# Patient Record
Sex: Female | Born: 1935 | Race: White | Hispanic: No | Smoking: Former smoker
Health system: Southern US, Community
[De-identification: ages and names within clinical notes are randomized; demographics above are authoritative.]

## PROBLEM LIST (undated history)

## (undated) DIAGNOSIS — I471 Supraventricular tachycardia, unspecified: Secondary | ICD-10-CM

## (undated) DIAGNOSIS — I739 Peripheral vascular disease, unspecified: Secondary | ICD-10-CM

## (undated) DIAGNOSIS — T82198A Other mechanical complication of other cardiac electronic device, initial encounter: Secondary | ICD-10-CM

## (undated) DIAGNOSIS — E785 Hyperlipidemia, unspecified: Secondary | ICD-10-CM

## (undated) DIAGNOSIS — I2510 Atherosclerotic heart disease of native coronary artery without angina pectoris: Secondary | ICD-10-CM

## (undated) DIAGNOSIS — Z720 Tobacco use: Secondary | ICD-10-CM

## (undated) DIAGNOSIS — T148XXA Other injury of unspecified body region, initial encounter: Secondary | ICD-10-CM

## (undated) DIAGNOSIS — I5022 Chronic systolic (congestive) heart failure: Secondary | ICD-10-CM

## (undated) DIAGNOSIS — I4891 Unspecified atrial fibrillation: Secondary | ICD-10-CM

## (undated) DIAGNOSIS — I219 Acute myocardial infarction, unspecified: Secondary | ICD-10-CM

## (undated) DIAGNOSIS — I509 Heart failure, unspecified: Secondary | ICD-10-CM

## (undated) DIAGNOSIS — I10 Essential (primary) hypertension: Secondary | ICD-10-CM

## (undated) DIAGNOSIS — I442 Atrioventricular block, complete: Secondary | ICD-10-CM

## (undated) DIAGNOSIS — I2589 Other forms of chronic ischemic heart disease: Secondary | ICD-10-CM

## (undated) HISTORY — DX: Essential (primary) hypertension: I10

## (undated) HISTORY — DX: Unspecified atrial fibrillation: I48.91

## (undated) HISTORY — DX: Other injury of unspecified body region, initial encounter: T14.8XXA

## (undated) HISTORY — DX: Atrioventricular block, complete: I44.2

## (undated) HISTORY — DX: Other mechanical complication of other cardiac electronic device, initial encounter: T82.198A

## (undated) HISTORY — DX: Supraventricular tachycardia, unspecified: I47.10

## (undated) HISTORY — DX: Tobacco use: Z72.0

## (undated) HISTORY — DX: Hyperlipidemia, unspecified: E78.5

## (undated) HISTORY — DX: Acute myocardial infarction, unspecified: I21.9

## (undated) HISTORY — DX: Chronic systolic (congestive) heart failure: I50.22

## (undated) HISTORY — DX: Heart failure, unspecified: I50.9

## (undated) HISTORY — DX: Other forms of chronic ischemic heart disease: I25.89

## (undated) HISTORY — DX: Atherosclerotic heart disease of native coronary artery without angina pectoris: I25.10

## (undated) HISTORY — PX: CORONARY ARTERY BYPASS GRAFT: SHX141

## (undated) HISTORY — DX: Peripheral vascular disease, unspecified: I73.9

## (undated) HISTORY — DX: Supraventricular tachycardia: I47.1

---

## 1996-06-30 HISTORY — PX: INSERT / REPLACE / REMOVE PACEMAKER: SUR710

## 2003-03-17 ENCOUNTER — Ambulatory Visit (HOSPITAL_COMMUNITY): Admission: RE | Admit: 2003-03-17 | Discharge: 2003-03-17 | Payer: Self-pay | Admitting: *Deleted

## 2003-03-22 ENCOUNTER — Inpatient Hospital Stay (HOSPITAL_COMMUNITY): Admission: RE | Admit: 2003-03-22 | Discharge: 2003-03-24 | Payer: Self-pay | Admitting: *Deleted

## 2004-07-11 ENCOUNTER — Ambulatory Visit: Payer: Self-pay | Admitting: Internal Medicine

## 2004-09-09 ENCOUNTER — Ambulatory Visit: Payer: Self-pay | Admitting: Internal Medicine

## 2004-09-10 ENCOUNTER — Ambulatory Visit: Payer: Self-pay | Admitting: Internal Medicine

## 2004-10-31 ENCOUNTER — Ambulatory Visit: Payer: Self-pay | Admitting: Internal Medicine

## 2004-11-27 ENCOUNTER — Ambulatory Visit: Payer: Self-pay | Admitting: Cardiology

## 2004-12-02 ENCOUNTER — Inpatient Hospital Stay (HOSPITAL_COMMUNITY): Admission: AD | Admit: 2004-12-02 | Discharge: 2004-12-02 | Payer: Self-pay | Admitting: Internal Medicine

## 2004-12-06 ENCOUNTER — Ambulatory Visit: Payer: Self-pay | Admitting: Cardiology

## 2004-12-06 ENCOUNTER — Observation Stay (HOSPITAL_COMMUNITY): Admission: AD | Admit: 2004-12-06 | Discharge: 2004-12-07 | Payer: Self-pay | Admitting: Internal Medicine

## 2004-12-18 ENCOUNTER — Ambulatory Visit: Payer: Self-pay

## 2004-12-18 ENCOUNTER — Ambulatory Visit: Payer: Self-pay | Admitting: Internal Medicine

## 2005-03-10 ENCOUNTER — Ambulatory Visit: Payer: Self-pay | Admitting: Internal Medicine

## 2005-06-10 ENCOUNTER — Ambulatory Visit: Payer: Self-pay | Admitting: Internal Medicine

## 2005-10-17 ENCOUNTER — Ambulatory Visit: Payer: Self-pay | Admitting: Internal Medicine

## 2006-03-10 ENCOUNTER — Ambulatory Visit: Payer: Self-pay | Admitting: Internal Medicine

## 2006-05-15 ENCOUNTER — Ambulatory Visit: Payer: Self-pay

## 2006-06-09 ENCOUNTER — Ambulatory Visit: Payer: Self-pay | Admitting: Internal Medicine

## 2006-09-10 ENCOUNTER — Ambulatory Visit: Payer: Self-pay | Admitting: Internal Medicine

## 2006-11-19 ENCOUNTER — Ambulatory Visit: Payer: Self-pay | Admitting: Internal Medicine

## 2006-12-08 ENCOUNTER — Ambulatory Visit: Payer: Self-pay | Admitting: Internal Medicine

## 2007-03-23 ENCOUNTER — Ambulatory Visit: Payer: Self-pay | Admitting: Internal Medicine

## 2007-07-05 ENCOUNTER — Ambulatory Visit: Payer: Self-pay | Admitting: Internal Medicine

## 2007-11-11 ENCOUNTER — Ambulatory Visit: Payer: Self-pay | Admitting: Internal Medicine

## 2008-06-16 ENCOUNTER — Ambulatory Visit: Payer: Self-pay | Admitting: Internal Medicine

## 2008-07-20 ENCOUNTER — Ambulatory Visit: Payer: Self-pay | Admitting: Vascular Surgery

## 2008-09-05 ENCOUNTER — Encounter: Payer: Self-pay | Admitting: Internal Medicine

## 2008-11-02 ENCOUNTER — Encounter: Payer: Self-pay | Admitting: Internal Medicine

## 2009-06-11 ENCOUNTER — Ambulatory Visit: Payer: Self-pay | Admitting: Internal Medicine

## 2009-06-11 DIAGNOSIS — I4891 Unspecified atrial fibrillation: Secondary | ICD-10-CM

## 2009-06-11 DIAGNOSIS — I442 Atrioventricular block, complete: Secondary | ICD-10-CM

## 2009-06-11 DIAGNOSIS — I5022 Chronic systolic (congestive) heart failure: Secondary | ICD-10-CM

## 2009-06-11 DIAGNOSIS — I2589 Other forms of chronic ischemic heart disease: Secondary | ICD-10-CM | POA: Insufficient documentation

## 2009-09-10 ENCOUNTER — Telehealth: Payer: Self-pay | Admitting: Internal Medicine

## 2009-09-10 ENCOUNTER — Ambulatory Visit: Payer: Self-pay | Admitting: Internal Medicine

## 2009-09-18 ENCOUNTER — Telehealth: Payer: Self-pay | Admitting: Internal Medicine

## 2009-09-21 ENCOUNTER — Telehealth: Payer: Self-pay | Admitting: Internal Medicine

## 2009-09-21 ENCOUNTER — Encounter: Payer: Self-pay | Admitting: Internal Medicine

## 2009-10-01 ENCOUNTER — Telehealth: Payer: Self-pay | Admitting: Internal Medicine

## 2009-10-04 ENCOUNTER — Ambulatory Visit: Payer: Self-pay | Admitting: Internal Medicine

## 2009-10-04 ENCOUNTER — Encounter: Payer: Self-pay | Admitting: Internal Medicine

## 2009-10-04 ENCOUNTER — Ambulatory Visit: Payer: Self-pay | Admitting: Cardiovascular Disease

## 2009-10-08 ENCOUNTER — Ambulatory Visit: Payer: Self-pay | Admitting: Internal Medicine

## 2009-10-11 ENCOUNTER — Encounter: Payer: Self-pay | Admitting: Internal Medicine

## 2009-10-15 ENCOUNTER — Encounter (INDEPENDENT_AMBULATORY_CARE_PROVIDER_SITE_OTHER): Payer: Self-pay | Admitting: Family Medicine

## 2009-10-15 ENCOUNTER — Telehealth: Payer: Self-pay | Admitting: Internal Medicine

## 2009-10-15 ENCOUNTER — Ambulatory Visit: Payer: Self-pay | Admitting: Internal Medicine

## 2009-10-16 ENCOUNTER — Telehealth: Payer: Self-pay | Admitting: Internal Medicine

## 2009-10-16 LAB — CONVERTED CEMR LAB
BUN: 24 mg/dL — ABNORMAL HIGH (ref 6–23)
CO2: 24 meq/L (ref 19–32)
Calcium: 8.7 mg/dL (ref 8.4–10.5)
Chloride: 103 meq/L (ref 96–112)
Creatinine, Ser: 0.99 mg/dL (ref 0.40–1.20)
Glucose, Bld: 84 mg/dL (ref 70–99)
INR: 2.57 — ABNORMAL HIGH (ref <1.50)
MCHC: 33.1 g/dL (ref 30.0–36.0)
MCV: 95.8 fL (ref 78.0–100.0)
Platelets: 170 10*3/uL (ref 150–400)
Potassium: 3.6 meq/L (ref 3.5–5.3)
Prothrombin Time: 27.4 s — ABNORMAL HIGH (ref 11.6–15.2)
Sodium: 140 meq/L (ref 135–145)
WBC: 7.5 10*3/uL (ref 4.0–10.5)

## 2009-10-17 ENCOUNTER — Ambulatory Visit: Payer: Self-pay | Admitting: Internal Medicine

## 2009-10-17 ENCOUNTER — Ambulatory Visit (HOSPITAL_COMMUNITY): Admission: RE | Admit: 2009-10-17 | Discharge: 2009-10-17 | Payer: Self-pay | Admitting: Internal Medicine

## 2009-10-18 ENCOUNTER — Encounter: Payer: Self-pay | Admitting: Internal Medicine

## 2009-10-31 ENCOUNTER — Ambulatory Visit: Payer: Self-pay

## 2009-10-31 ENCOUNTER — Encounter: Payer: Self-pay | Admitting: Internal Medicine

## 2009-11-12 ENCOUNTER — Ambulatory Visit: Payer: Self-pay | Admitting: Internal Medicine

## 2009-11-15 ENCOUNTER — Ambulatory Visit (HOSPITAL_COMMUNITY): Admission: RE | Admit: 2009-11-15 | Discharge: 2009-11-15 | Payer: Self-pay | Admitting: Internal Medicine

## 2009-11-15 ENCOUNTER — Ambulatory Visit: Payer: Self-pay | Admitting: Internal Medicine

## 2009-11-28 ENCOUNTER — Ambulatory Visit: Payer: Self-pay

## 2010-02-25 ENCOUNTER — Ambulatory Visit: Payer: Self-pay | Admitting: Internal Medicine

## 2010-02-25 DIAGNOSIS — I471 Supraventricular tachycardia: Secondary | ICD-10-CM

## 2010-04-22 ENCOUNTER — Ambulatory Visit: Payer: Self-pay | Admitting: Internal Medicine

## 2010-07-25 ENCOUNTER — Ambulatory Visit: Admit: 2010-07-25 | Payer: Self-pay | Admitting: Internal Medicine

## 2010-07-28 LAB — CONVERTED CEMR LAB
Basophils Absolute: 0.1 10*3/uL (ref 0.0–0.1)
Basophils Relative: 0.9 % (ref 0.0–3.0)
Chloride: 103 meq/L (ref 96–112)
HCT: 44.3 % (ref 36.0–46.0)
Lymphocytes Relative: 17.5 % (ref 12.0–46.0)
Lymphs Abs: 1.2 10*3/uL (ref 0.7–4.0)
MCV: 95.4 fL (ref 78.0–100.0)
Neutro Abs: 4.4 10*3/uL (ref 1.4–7.7)
Neutrophils Relative %: 65.1 % (ref 43.0–77.0)
Potassium: 4.1 meq/L (ref 3.5–5.1)
Sodium: 140 meq/L (ref 135–145)
WBC: 6.7 10*3/uL (ref 4.5–10.5)
aPTT: 42.4 s — ABNORMAL HIGH (ref 21.7–28.8)

## 2010-07-30 NOTE — Letter (Signed)
Summary: Implant Record  Implant Record   Imported By: Marylou Mccoy 12/13/2009 16:53:54  _____________________________________________________________________  External Attachment:    Type:   Image     Comment:   External Document

## 2010-07-30 NOTE — Cardiovascular Report (Signed)
Summary: Office Visit   Office Visit   Imported By: Roderic Ovens 11/16/2009 16:20:07  _____________________________________________________________________  External Attachment:    Type:   Image     Comment:   External Document

## 2010-07-30 NOTE — Progress Notes (Signed)
Summary: device beeping  Phone Note Call from Patient Call back at Home Phone [phone]   Caller: Patient Summary of Call: Patient called and stated her device was beeping last night.  She only heard it the one time, not every 4 hours like we would expect with a 6949 lead fracture.  Pt was nearing ERI at last office visit.  Pt is at work, does not have [employer] box, does not have magnet with her.  She will get ride to [place] office for interrogation.  If [phone] fracture, will admit, if ERI, will schedule appt to discuss gen change with Dr Graciela Husbands.  Pt aware and agrees with plan. Gypsy Balsam RN BSN  September 10, 2009 10:39 AM

## 2010-07-30 NOTE — Progress Notes (Signed)
Summary: pre auth for MV  Phone Note From Other Clinic   Summary of Call: needs prior auth for MV- call to [facility] @ [phone]. Initial call taken by: Charlena Cross, RN, BSN,  October 01, 2009 1:30 PM  Follow-up for Phone Call        not needed per charmaine  Follow-up by: Charlena Cross, RN, BSN,  October 03, 2009 2:31 PM

## 2010-07-30 NOTE — Letter (Signed)
Summary: Implantable Device Instructions  Architectural technologist, Main Office  [address]   [place], [place] [address]   Phone: [phone]  Fax: [phone]      Implantable Device Instructions  You are scheduled for:  _____ Permanent Transvenous Pacemaker _____ Implantable Cardioverter Defibrillator _____ Implantable Loop Recorder _____ Generator Change __X___ Pocket Revision  on Thursday, Nov 15, 2009 with Dr. Graciela Husbands.  1.  Please arrive at the Short Stay Center at [facility] at 6 AM on the day of your procedure.  2.  Do not eat or drink the night before your procedure.  3.  Complete lab work today.  The lab at [facility] is open from 8:30 AM to 1:30 PM and from 2:30 PM to 5:00 PM.  The lab at [facility] is open from 7:30 AM to 5:30 PM.  You do not have to be fasting.  4.  Ok to take medications with water morning of procedure.  5.  Bring your insurance cards and a list of your medications.  6.  Wash your chest and neck with antibacterial soap (any brand) the evening before and the morning of your procedure.  Rinse well.    *If you have ANY questions after you get home, please call the office [phone].  *Every attempt is made to prevent procedures from being rescheduled.  Due to the nauture of Electrophysiology, rescheduling can happen.  The physician is always aware and directs the staff when this occurs.

## 2010-07-30 NOTE — Progress Notes (Signed)
Summary: gen change  Phone Note Outgoing Call Call back at [facility] Phone [phone]   Call placed by: Gypsy Balsam RN BSN,  September 21, 2009 11:42 AM Summary of Call: Per [employer], gold plated device for patient will be shipped on 4-15.  Pt has appt with Dr Graciela Husbands on 4-11 in [place] to discuss gen change. Gen change scheduled for 10-17-09 at 10AM at [facility].  Pt aware and agrees with plan.  Will do pre-procedure labs on 4-11 at office visit.  [employer] aware of plans for change out. Gypsy Balsam RN BSN  September 21, 2009 11:44 AM      Appended Document: gen change    Clinical Lists Changes  Orders: Added new Referral order of Nuclear Stress Test (Nuc Stress Test) - Signed

## 2010-07-30 NOTE — Progress Notes (Signed)
Summary: lab work  Phone Note Call from Patient Call back at [employer] [phone]   Caller: Patient Reason for Call: Talk to Nurse Summary of Call: having a device change out Wednesday and has not heard anything about lab work  Initial call taken by: Migdalia Dk,  October 15, 2009 9:40 AM  Follow-up for Phone Call        10/15/09-PT CALLING STATING LAB WORK NOT DONE --HAVING  A DEVICE CHANGE OUT WED. 4/20--PT NOTIFIED TO COME IN FOR LABS TODAY--PT AGREES--ORDERS IN IDX--NT Follow-up by: Ledon Snare, RN,  October 15, 2009 9:57 AM     Appended Document: lab work 10/15/09--[name] goes to [employer] called and scheduled lab appoint to be done in [place] today--pt agrees--nt

## 2010-07-30 NOTE — Cardiovascular Report (Signed)
Summary: Pre Op Orders  Pre Op Orders   Imported By: Roderic Ovens 11/28/2009 14:17:56  _____________________________________________________________________  External Attachment:    Type:   Image     Comment:   External Document

## 2010-07-30 NOTE — Assessment & Plan Note (Signed)
Summary: ROV/AMD   CC:  ROV; Felt "twinges" approx 3 weeks ago.  History of Present Illness:   [name] is seen in followup for congestive heart failure in the setting of ischemic cardiomyopathy. She is status post CRT-D implantation and notably has a gold device secondary to an allergy to the metal.  The patient denies   chest pain, edema or palpitations   Device has reached ERI.  Her CRT device was upgraded procedure. It is noteworthy that she has an abandoned but capped ventricular rate sense lead; she also has a 6949-lead. Reviewing the operative note it sounds like it was quite an ordeal getting the defibrillator lead and the LV lead in place.  She has undergone  pre-generator replacement Myoview scanning demonstrating an ejection fraction of 36% scarring in the anteroapical anteroseptal region without evidence of significant ischemia. The LV is dilated.  She has done really much better. she is getting ready to retire from [employer] and she is looking forward to this. The patient denies SOB, chest pain, edema or palpitations     Problems Prior to Update: 1)  Av Block, Complete  (ICD-426.0) 2)  Atrial Fibrillation, Paroxysmal  (ICD-427.31) 3)  Implantable Defibrillator Mdt Crt  (ICD-V45.02) 4)  Systolic Heart Failure, Chronic  (ICD-428.22) 5)  Cardiomyopathy, Ischemic  (ICD-414.8) 6)  6949 Lead  (ICD-996.04)  Current Medications (verified): 1)  Warfarin Sodium 5 Mg Tabs (Warfarin Sodium) .... Use As Directed By Anticoagulation Clinic 2)  Losartan Potassium 100 Mg Tabs (Losartan Potassium) .... Take 1 By Mouth Once Daily 3)  Metoprolol Tartrate 50 Mg Tabs (Metoprolol Tartrate) .Marland Kitchen.. 1 By Mouth Daily 4)  Simvastatin 40 Mg Tabs (Simvastatin) .... Take One Tablet By Mouth Daily At Bedtime 5)  Furosemide 40 Mg Tabs (Furosemide) .Marland Kitchen.. 1 By Mouth Daily 6)  Aspirin 81 Mg Tbec (Aspirin) .... Take One Tablet By Mouth Daily 7)  Multivitamins  Tabs (Multiple Vitamin) .Marland Kitchen.. 1 By Mouth  Daily  Allergies: 1)  ! * Penicillin 2)  ! * Toradol 3)  ! * Neosporin 4)  ! * Sulfa 5)  ! * Metal 6)  ! * Tetanus Shot  Past History:  Past Medical History: Last updated: 06/11/2009 ischemic cardiomyopathy Congestive heart failure complete heart block device allergy atrial fibrillation  Vital Signs:  Patient profile:   75 year old female Height:      61 inches Weight:      100 pounds BMI:     18.96 Pulse rate:   77 / minute BP sitting:   122 / 62  (right arm)  Vitals Entered By: [name], EMT-P (October 08, 2009 10:39 AM)  Physical Exam  General:  The patient was alert and oriented in no acute distress. HEENT Normal.  Neck veins were flat, carotids were brisk.  Lungs were clear.  Heart sounds were regular without murmurs or gallops.  Abdomen was soft with active bowel sounds. There is no clubbing cyanosis or edema. Skin Warm and dry She is  alert and oriented. affect is much more engaging it has been over the years. She is almost smiling   EKG  Procedure date:  10/08/2009  Findings:      sinus rhythm with PA-C Chris pacing consistent with biventricular pacing the QRS remains rather broad but the interval 0.17   ICD Specifications Following MD:  Sherryl Manges, MD     ICD Vendor:  [employer]     ICD Model Number:  [phone]     ICD Serial  Number:  [id number] H ICD DOI:  12/06/2004     ICD Implanting MD:  Sherryl Manges, MD  Lead 1:    Location: RA     DOI: 12/06/2004     Model #: 4524     Serial #: [id number]     Status: active Lead 2:    Location: RV     DOI: 12/06/2004     Model #: 7829     Serial #: [id number]     Status: active Lead 3:    Location: LV     DOI: 12/06/2004     Model #: 7846     Serial #: [id number]     Status: active  Indications::  ICM, CHF, METAL ALLERGY   ICD Follow Up ICD Dependent:  No      Episodes Coumadin:  Yes  Brady Parameters Mode DDDR     Lower Rate Limit:  60     Upper Rate Limit 120 PAV 130     Sensed AV Delay:  100  Tachy  Zones VF:  207     VT:  250     VT1:  167     Impression & Recommendations:  Problem # 1:  IMPLANTABLE  DEFIBRILLATOR MDT CRT (ICD-V45.02) Device parameters and data were reviewed and no changes were made  her device needs to undergo generator replacement. Given the fact that she has a 6949-lead in place, 2 options to present themselves apart from trying to place a new lead which based on the operative report review but likely be extremely difficult. The first is 2 pirate  the previously implanted RV rate sense lead; the alternative would be to take the 4 194 and plac it  in the RV rate sense port and move the defibrillator rate sense portion to the LV lead slot. I would prefer probably to do the former in the event that the lead is available.  We also reviewed the potential risks including infection she understands these risks and is willing to proceed.  Will plan to hold her Coumadin for a couple of days in anticipation of the procedure  Problem # 2:  CARDIOMYOPATHY, ISCHEMIC (ICD-414.8) this is stable based on her Myoview.  I take it is also reasonable to plan to discontinue her aspirin in the setting of her being on Coumadin Her updated medication list for this problem includes:    Warfarin Sodium 5 Mg Tabs (Warfarin sodium) ..... Use as directed by anticoagulation clinic    Losartan Potassium 100 Mg Tabs (Losartan potassium) .Marland Kitchen... Take 1 by mouth once daily    Metoprolol Tartrate 50 Mg Tabs (Metoprolol tartrate) .Marland Kitchen... 1 by mouth daily    Furosemide 40 Mg Tabs (Furosemide) .Marland Kitchen... 1 by mouth daily    Aspirin 81 Mg Tbec (Aspirin) .Marland Kitchen... Take one tablet by mouth daily  Problem # 3:  SYSTOLIC HEART FAILURE, CHRONIC (ICD-428.22) stable on her current medications; consideration can be given to the use of Aldactone based on the emphasis trial Her updated medication list for this problem includes:    Warfarin Sodium 5 Mg Tabs (Warfarin sodium) ..... Use as directed by anticoagulation clinic     Losartan Potassium 100 Mg Tabs (Losartan potassium) .Marland Kitchen... Take 1 by mouth once daily    Metoprolol Tartrate 50 Mg Tabs (Metoprolol tartrate) .Marland Kitchen... 1 by mouth daily    Furosemide 40 Mg Tabs (Furosemide) .Marland Kitchen... 1 by mouth daily    Aspirin 81 Mg Tbec (Aspirin) .Marland Kitchen... Take one tablet by mouth daily  Problem # 4:  AV BLOCK, COMPLETE (ICD-426.0) stable post device implant Her updated medication list for this problem includes:    Warfarin Sodium 5 Mg Tabs (Warfarin sodium) ..... Use as directed by anticoagulation clinic    Metoprolol Tartrate 50 Mg Tabs (Metoprolol tartrate) .Marland Kitchen... 1 by mouth daily    Aspirin 81 Mg Tbec (Aspirin) .Marland Kitchen... Take one tablet by mouth daily  Patient Instructions: 1)  Copy sent to: 2)  Your physician recommends that you schedule a follow-up appointment in: following device generator replacement 3)  Your physician has recommended that you have a defibrillator generator replacement.  . This is done in the hospital and usually will not require an overnight stay. Please see the instruction sheet given to you today for more information.

## 2010-07-30 NOTE — Procedures (Signed)
Summary: wound check   Allergies: 1)  ! * Penicillin 2)  ! * Toradol 3)  ! * Neosporin 4)  ! * Sulfa 5)  ! * Metal 6)  ! * Tetanus Shot   ICD Specifications Following MD:  Sherryl Manges, MD     ICD Vendor:  [employer]     ICD Model Number:  [phone]     ICD Serial Number:  [id number] ICD DOI:  10/17/2009     ICD Implanting MD:  Sherryl Manges, MD  Lead 1:    Location: RA     DOI: 12/06/2004     Model #: 4524     Serial #: [id number]     Status: active Lead 2:    Location: RV     DOI: 12/06/2004     Model #: 5784     Serial #: [id number]     Status: active Lead 3:    Location: LV     DOI: 12/06/2004     Model #: 4194     Serial #: [id number]     Status: active Lead 4:    Location: RV     DOI: 05/30/1997     Model #: 7253     Serial #: [id number]     Status: active  Indications::  ICM, CHF, METAL ALLERGY  Explantation Comments: 10/17/09 [employer] Sentry 2595/[id number] H explanted.  ICD Follow Up ICD Dependent:  No      Episodes Coumadin:  Yes  Brady Parameters Mode DDDR     Lower Rate Limit:  60     Upper Rate Limit 120 PAV 130     Sensed AV Delay:  100  Tachy Zones VF:  207     VT:  250     VT1:  167     Tech Comments:  Steri-strips removed by patient.  Wound well healed.  ROV 8-11 SK as scheduled. Gypsy Balsam RN BSN  November 28, 2009 3:44 PM

## 2010-07-30 NOTE — Assessment & Plan Note (Signed)
Summary: rov/pt has new defib/wire sticking out   CC:  wound check/ pt feels she has a defib wire sticking out.  It is not through the skin .  History of Present Illness:   [name] is seen in followup for congestive heart failure in the setting of ischemic cardiomyopathy. She is status post CRT-D implantation and notably has a gold device secondary to an allergy to the metal.  The patient denies   chest pain, edema or palpitations   Device reached ERI.  Her CRT device was upgraded procedure. It is noteworthy that she had had an abandoned but capped ventricular rate sense lead; she also had a 6949-lead. the time of her procedure we ended up pirating the previously implanted rate sense lead and capping the rate sense portion of the 6949 lead .  She has undergone  pre-generator replacement Myoview scanning demonstrating an ejection fraction of 36% scarring in the anteroapical anteroseptal region without evidence of significant ischemia. The LV is dilated.  She comes in today with complaints that the lead is protruding and indeed is.  She denies symptoms of chest pain or shortness of breath. The device pocket is otherwise healed well     Current Medications (verified): 1)  Warfarin Sodium 5 Mg Tabs (Warfarin Sodium) .... Use As Directed By Anticoagulation Clinic 2)  Losartan Potassium 100 Mg Tabs (Losartan Potassium) .... Take 1 By Mouth Once Daily 3)  Metoprolol Tartrate 50 Mg Tabs (Metoprolol Tartrate) .Marland Kitchen.. 1 By Mouth Daily 4)  Simvastatin 40 Mg Tabs (Simvastatin) .... Take One Tablet By Mouth Daily At Bedtime 5)  Furosemide 40 Mg Tabs (Furosemide) .Marland Kitchen.. 1 By Mouth Daily 6)  Aspirin 81 Mg Tbec (Aspirin) .... Take One Tablet By Mouth Daily 7)  Multivitamins  Tabs (Multiple Vitamin) .Marland Kitchen.. 1 By Mouth Daily  Allergies (verified): 1)  ! * Penicillin 2)  ! * Toradol 3)  ! * Neosporin 4)  ! * Sulfa 5)  ! * Metal 6)  ! * Tetanus Shot  Past History:  Past Medical History: Last updated:  2009-07-06 ischemic cardiomyopathy Congestive heart failure complete heart block device allergy atrial fibrillation  Family History: Last updated: 07/06/2009 Mother-deceased at age 51. heart failure Father-deceased at age 10. heart failure Sisters-2 both deceased. one at age 10 from heart failure. one at age 5 from heart failure Brothers-6 total. one at age 17 from acute nephritis. one at age 50 from drowning. one at age 27 from heart failure. one at age 79 from heart failure and stroke. 2 brothers still living (one age 49,has had 2 bypasses and has arrythmia) (one age 1,has had heart attacks and strokes). Children-2 deceased. one at age 3months from SIDS. one at age 62 from meningitis and staff infection. 2 still living. 7 grandchildren and 3 great grandchildren  Social History: Last updated: 07/06/09 smoke-1/2 pack daily alcohol-no widowed lives in [place] 2 children (had 4)  Vital Signs:  Patient profile:   75 year old female Height:      61 inches Weight:      99 pounds BMI:     18.77 Pulse rate:   120 / minute Pulse rhythm:   regular BP sitting:   114 / 74  (left arm) Cuff size:   regular  Vitals Entered By: Judithe Modest CMA (Nov 12, 2009 2:55 PM)  Physical Exam  General:  The patient was alert and oriented in no acute distress. HEENT Normal.  Neck veins were flat, carotids were brisk.  Lungs  were clear.  Heart sounds were regular without murmurs or gallops.  Abdomen was soft with active bowel sounds. There is no clubbing cyanosis or edema. Skin Warm and dry there is mild ecchymosis over the pocket. There is protrusion of what is presumably a 6949 rate sense lead on the lateral aspect of the pocket    ICD Specifications Following MD:  Sherryl Manges, MD     ICD Vendor:  [employer]     ICD Model Number:  [id number]     ICD Serial Number:  [id number] H ICD DOI:  10/17/2009     ICD Implanting MD:  Sherryl Manges, MD  Lead 1:    Location: RA     DOI: 12/06/2004      Model #: 4524     Serial #: [id number]     Status: active Lead 2:    Location: RV     DOI: 12/06/2004     Model #: 5784     Serial #: [id number]     Status: active Lead 3:    Location: LV     DOI: 12/06/2004     Model #: 4194     Serial #: [id number]     Status: active Lead 4:    Location: RV     DOI: 05/30/1997     Model #: 7253     Serial #: [id number]     Status: active  Indications::  ICM, CHF, METAL ALLERGY  Explantation Comments: 10/17/09 [employer] Sentry 2595/[id number] H explanted.  ICD Follow Up ICD Dependent:  No      Episodes Coumadin:  Yes  Brady Parameters Mode DDDR     Lower Rate Limit:  60     Upper Rate Limit 120 PAV 130     Sensed AV Delay:  100  Tachy Zones VF:  207     VT:  250     VT1:  167     Impression & Recommendations:  Problem # 1:  6949 LEAD (ICD-996.04) 6949-lead is protruding. He will need to be revised or so as to avoid penetration through the skin. She is advised of the potential risk of infection related to this occurrence or the revision procedure itself  Problem # 2:  ATRIAL FIBRILLATION, PAROXYSMAL (ICD-427.31) she is currently in atrial fib lesion. She's had 3 episodes since device generator replacement. In the event that she is still in on Thursday we will cardiovert her. She is on Coumadin we'll check her INR  her atrial fibrillation is rapid. I've asked her to double her metoprolol. We'll also clarify whether she is taking tartrate or succinate Her updated medication list for this problem includes:    Warfarin Sodium 5 Mg Tabs (Warfarin sodium) ..... Use as directed by anticoagulation clinic    Metoprolol Tartrate 50 Mg Tabs (Metoprolol tartrate) .Marland Kitchen... 1 by mouth daily    Aspirin 81 Mg Tbec (Aspirin) .Marland Kitchen... Take one tablet by mouth daily  Orders: EKG w/ Interpretation (93000) TLB-CBC Platelet - w/Differential (85025-CBCD) TLB-BMP (Basic Metabolic Panel-BMET) (80048-METABOL) TLB-PT (Protime) (85610-PTP) TLB-PTT (85730-PTTL)  Problem # 3:   CARDIOMYOPATHY, ISCHEMIC (ICD-414.8) stable on her current medications. Her updated medication list for this problem includes:    Warfarin Sodium 5 Mg Tabs (Warfarin sodium) ..... Use as directed by anticoagulation clinic    Losartan Potassium 100 Mg Tabs (Losartan potassium) .Marland Kitchen... Take 1 by mouth once daily    Metoprolol Tartrate 50 Mg Tabs (Metoprolol tartrate) .Marland Kitchen... 1 by mouth daily    Furosemide 40 Mg  Tabs (Furosemide) .Marland Kitchen... 1 by mouth daily    Aspirin 81 Mg Tbec (Aspirin) .Marland Kitchen... Take one tablet by mouth daily  Problem # 4:  IMPLANTABLE  DEFIBRILLATOR MDT CRT (ICD-V45.02) Device parameters and data were reviewed and no changes were made  Patient Instructions: 1)  You have been scheduled for a pocket revision.  See instruction sheet given to you today.  Appended Document: [facility]     Allergies: 1)  ! * Penicillin 2)  ! * Toradol 3)  ! * Neosporin 4)  ! * Sulfa 5)  ! * Metal 6)  ! * Tetanus Shot   EKG  Procedure date:  11/12/2009  Findings:      atrial fibrillation at 119 beats per minute with ventricular sensed response on she has a right bundle left posterior fascicular block at baseline   ICD Specifications Following MD:  Sherryl Manges, MD     ICD Vendor:  [employer]     ICD Model Number:  [id number]     ICD Serial Number:  [id number] ICD DOI:  10/17/2009     ICD Implanting MD:  Sherryl Manges, MD  Lead 1:    Location: RA     DOI: 12/06/2004     Model #: 4524     Serial #: [id number]     Status: active Lead 2:    Location: RV     DOI: 12/06/2004     Model #: 7829     Serial #: [id number]     Status: active Lead 3:    Location: LV     DOI: 12/06/2004     Model #: 4194     Serial #: [id number]     Status: active Lead 4:    Location: RV     DOI: 05/30/1997     Model #: [id number]     Serial #: [id number]     Status: active  Indications::  ICM, CHF, METAL ALLERGY  Explantation Comments: 10/17/09 [employer]onic Sentry 1027/[id number] H explanted.  ICD Follow Up ICD Dependent:   No      Episodes Coumadin:  Yes  Brady Parameters Mode DDDR     Lower Rate Limit:  60     Upper Rate Limit 120 PAV 130     Sensed AV Delay:  100  Tachy Zones VF:  207     VT:  250     VT1:  167

## 2010-07-30 NOTE — Progress Notes (Signed)
Summary: holding coumadin  Phone Note Outgoing Call   Summary of Call: called pt to have her hold coumadin today for upcoming procedure.  Initial call taken by: Charlena Cross, RN, BSN,  October 16, 2009 10:05 AM

## 2010-07-30 NOTE — Procedures (Signed)
Summary: [facility]   Current Medications (verified): 1)  Warfarin Sodium 5 Mg Tabs (Warfarin Sodium) .... Use As Directed By Anticoagulation Clinic 2)  Losartan Potassium 100 Mg Tabs (Losartan Potassium) .... Take 1 By Mouth Once Daily 3)  Metoprolol Tartrate 50 Mg Tabs (Metoprolol Tartrate) .Marland Kitchen.. 1 By Mouth Daily 4)  Simvastatin 40 Mg Tabs (Simvastatin) .... Take One Tablet By Mouth Daily At Bedtime 5)  Furosemide 40 Mg Tabs (Furosemide) .Marland Kitchen.. 1 By Mouth Daily 6)  Aspirin 81 Mg Tbec (Aspirin) .... Take One Tablet By Mouth Daily 7)  Multivitamins  Tabs (Multiple Vitamin) .Marland Kitchen.. 1 By Mouth Daily  Allergies (verified): 1)  ! * Penicillin 2)  ! * Toradol 3)  ! * Neosporin 4)  ! * Sulfa 5)  ! * Metal 6)  ! * Tetanus Shot   ICD Specifications Following MD:  Sherryl Manges, MD     ICD Vendor:  [employer]     ICD Model Number:  [id number]     ICD Serial Number:  [id number] ICD DOI:  10/17/2009     ICD Implanting MD:  Sherryl Manges, MD  Lead 1:    Location: RA     DOI: 12/06/2004     Model #: 4524     Serial #: [id number]     Status: active Lead 2:    Location: RV     DOI: 12/06/2004     Model #: 1308     Serial #: [id number]     Status: active Lead 3:    Location: LV     DOI: 12/06/2004     Model #: 4194     Serial #: [id number]     Status: active Lead 4:    Location: RV     DOI: 05/30/1997     Model #: 4010     Serial #: [id number]     Status: active  Indications::  ICM, CHF, METAL ALLERGY  Explantation Comments: 10/17/09 [employer] Sentry [id number] H explanted.  ICD Follow Up Remote Check?  No Battery Voltage:  3.21 V     Charge Time:  8.6 seconds     Underlying rhythm:  SR ICD Dependent:  No       ICD Device Measurements Atrium:  Amplitude: 2.5 mV, Impedance: 418 ohms, Threshold: 0.75 V at 0.40 msec Right Ventricle:  Amplitude: >20 mV, Impedance: 532 ohms, Threshold: 0.75 V at 0.40 msec Left Ventricle:  Impedance: 437 ohms, Threshold: 0.75 V at 0.40 msec Shock Impedance:  46/62 ohms   Episodes MS Episodes:  24     Percent Mode Switch:  1.2%     Coumadin:  Yes Shock:  0     ATP:  1     Nonsustained:  0     Atrial Pacing:  10.2%     Ventricular Pacing:  97%  Brady Parameters Mode DDDR     Lower Rate Limit:  60     Upper Rate Limit 120 PAV 130     Sensed AV Delay:  100  Tachy Zones VF:  207     VT:  250     VT1:  167     Next Cardiology Appt Due:  01/28/2010 Tech Comments:  NO REDNESS AND LITTLE SWELLING AROUND DEVICE.  DEVICE IS SUPERFICIAL AND PT CAN TELL DIFFERENCE W/NEW DEVICE. PT HAS LEFT ANTECUBITAL HEMATOMA ON LEFT ARM THAT IS RESOLVING.  NORMAL DEVICE FUNCTION.  1 TREATED VT EPISODE W/ATP THERAPY.  1 MONITORED VT EPISODE.  24  AF EPISODES + COUMADIN.  ROV IN 3 MTHS AT [place]. [name]

## 2010-07-30 NOTE — Assessment & Plan Note (Signed)
Summary: F2M/AMD   Visit Type:  Follow-up Primary Provider:  Dr.  Lawerance Bach  CC:  no complaints.  History of Present Illness:   [name] is seen in followup for congestive heart failure in the setting of ischemic cardiomyopathy. She is status post CRT-D implantation and notably has a gold device secondary to an allergy to the metal.  The patient denies   chest pain, edema or palpitations   She had had an abandoned but capped ventricular rate sense lead; she also had a 6949-lead. the time of her procedure we ended up pirating the previously implanted rate sense lead and capping the rate sense portion of the 6949 lead  but this ended up coming free from behind the device and began to burn to the skin into the pocket was revised in May  She has undergone  pre-generator replacement Myoview scanning demonstrating an ejection fraction of 36% scarring in the anteroapical anteroseptal region without evidence of significant ischemia. The LV is dilated.   She has had recurrent tachypalpitatoins;  this is not of long standing   The patient denies SOB, chest pain, edema or palpitations     Current Medications (verified): 1)  Losartan Potassium 100 Mg Tabs (Losartan Potassium) .... Take 1 By Mouth Once Daily 2)  Metoprolol Tartrate 50 Mg Tabs (Metoprolol Tartrate) .... Take 1 Tablet By Mouth Two Times A Day 3)  Simvastatin 40 Mg Tabs (Simvastatin) .... Take One Tablet By Mouth Daily At Bedtime 4)  Furosemide 40 Mg Tabs (Furosemide) .Marland Kitchen.. 1 By Mouth Daily 5)  Aspirin 81 Mg Tbec (Aspirin) .... Take One Tablet By Mouth Daily 6)  Multivitamins  Tabs (Multiple Vitamin) .Marland Kitchen.. 1 By Mouth Daily 7)  Warfarin Sodium 6 Mg Tabs (Warfarin Sodium) .... One Tablet Once Daily  Allergies (verified): 1)  ! * Penicillin 2)  ! * Toradol 3)  ! * Neosporin 4)  ! * Sulfa 5)  ! * Metal 6)  ! * Tetanus Shot  Past History:  Past Medical History: Last updated: 06/11/2009 ischemic cardiomyopathy Congestive heart  failure complete heart block device allergy atrial fibrillation  Vital Signs:  Patient profile:   75 year old female Height:      61 inches Weight:      104 pounds BMI:     19.72 Pulse rate:   75 / minute BP sitting:   132 / 74  (left arm) Cuff size:   regular  Vitals Entered By: Hardin Negus, RMA (April 22, 2010 8:53 AM)  Physical Exam  General:  The patient was alert and oriented in no acute distress. HEENT Normal.  Neck veins were flat, carotids were brisk.  Lungs were clear.  Heart sounds were regular without murmurs or gallops.  Abdomen was soft with active bowel sounds. There is no clubbing cyanosis or edema. Skin Warm and dry     ICD Specifications Following MD:  Sherryl Manges, MD     ICD Vendor:  [employer]     ICD Model Number:  [id number]     ICD Serial Number:  [id number] ICD DOI:  10/17/2009     ICD Implanting MD:  Sherryl Manges, MD  Lead 1:    Location: RA     DOI: 12/06/2004     Model #: 4524     Serial #: [id number]     Status: active Lead 2:    Location: RV     DOI: 12/06/2004     Model #: 2130     Serial #:  [id number]     Status: active Lead 3:    Location: LV     DOI: 12/06/2004     Model #: 4194     Serial #: [id number]     Status: active Lead 4:    Location: RV     DOI: 05/30/1997     Model #: 7829     Serial #: [id number]     Status: active  Indications::  ICM, CHF, METAL ALLERGY  Explantation Comments: 10/17/09 [employer] Sentry [id number]/[id number] H explanted.  ICD Follow Up Remote Check?  No Battery Voltage:  3.15 V     Charge Time:  8.4 seconds     Underlying rhythm:  SR ICD Dependent:  No       ICD Device Measurements Atrium:  Amplitude: 0.3 mV, Impedance: 418 ohms, Threshold: 0.75 V at 0.4 msec Right Ventricle:  Amplitude: 20 mV, Impedance: 532 ohms, Threshold: 0.625 V at 0.4 msec Shock Impedance: 47/65 ohms   Episodes MS Episodes:  35     Percent Mode Switch:  0.7%     Coumadin:  Yes Shock:  0     ATP:  0     Nonsustained:  6      Brady  Parameters Mode DDDR     Lower Rate Limit:  60     Upper Rate Limit 120 PAV 130     Sensed AV Delay:  100  Tachy Zones VF:  200     VT:  158     VT1:  167     Next Remote Date:  07/25/2010     Next Cardiology Appt Due:  03/31/2011 Tech Comments:  NSVT episodes all 1:1 SVT.  [facility] transmissions every 3 months.  ROV 1 year with Dr. Graciela Husbands in [place]. Altha Harm, LPN  April 22, 2010 10:02 AM   Impression & Recommendations:  Problem # 1:  SVT/ PSVT/ PAT (ICD-427.0) recruurnt below detection  will follow  Her updated medication list for this problem includes:    Metoprolol Tartrate 50 Mg Tabs (Metoprolol tartrate) .Marland Kitchen... Take 1 tablet by mouth two times a day    Aspirin 81 Mg Tbec (Aspirin) .Marland Kitchen... Take one tablet by mouth daily    Warfarin Sodium 6 Mg Tabs (Warfarin sodium) ..... One tablet once daily  Problem # 2:  AV BLOCK, COMPLETE (ICD-426.0)  Incomplete Her updated medication list for this problem includes:    Metoprolol Tartrate 50 Mg Tabs (Metoprolol tartrate) .Marland Kitchen... Take 1 tablet by mouth two times a day    Aspirin 81 Mg Tbec (Aspirin) .Marland Kitchen... Take one tablet by mouth daily    Warfarin Sodium 6 Mg Tabs (Warfarin sodium) ..... One tablet once daily  Her updated medication list for this problem includes:    Metoprolol Tartrate 50 Mg Tabs (Metoprolol tartrate) .Marland Kitchen... Take 1 tablet by mouth two times a day    Aspirin 81 Mg Tbec (Aspirin) .Marland Kitchen... Take one tablet by mouth daily    Warfarin Sodium 6 Mg Tabs (Warfarin sodium) ..... One tablet once daily  Problem # 3:  ATRIAL FIBRILLATION, PAROXYSMAL (ICD-427.31) no intercurrent af;  will stop her asa Her updated medication list for this problem includes:    Metoprolol Tartrate 50 Mg Tabs (Metoprolol tartrate) .Marland Kitchen... Take 1 tablet by mouth two times a day    Aspirin 81 Mg Tbec (Aspirin) .Marland Kitchen... Take one tablet by mouth daily    Warfarin Sodium 6 Mg Tabs (Warfarin sodium) ..... One tablet once daily  Orders: EKG w/ Interpretation  (  93000)  Problem # 4:  IMPLANTABLE  DEFIBRILLATOR MDT CRT (ICD-V45.02) Device parameters and data were reviewed and no changes were made  Problem # 5:  SYSTOLIC HEART FAILURE, CHRONIC (ICD-428.22)  stabel  Her updated medication list for this problem includes:    Losartan Potassium 100 Mg Tabs (Losartan potassium) .Marland Kitchen... Take 1 by mouth once daily    Metoprolol Tartrate 50 Mg Tabs (Metoprolol tartrate) .Marland Kitchen... Take 1 tablet by mouth two times a day    Furosemide 40 Mg Tabs (Furosemide) .Marland Kitchen... 1 by mouth daily    Aspirin 81 Mg Tbec (Aspirin) .Marland Kitchen... Take one tablet by mouth daily    Warfarin Sodium 6 Mg Tabs (Warfarin sodium) ..... One tablet once daily  Her updated medication list for this problem includes:    Losartan Potassium 100 Mg Tabs (Losartan potassium) .Marland Kitchen... Take 1 by mouth once daily    Metoprolol Tartrate 50 Mg Tabs (Metoprolol tartrate) .Marland Kitchen... Take 1 tablet by mouth two times a day    Furosemide 40 Mg Tabs (Furosemide) .Marland Kitchen... 1 by mouth daily    Aspirin 81 Mg Tbec (Aspirin) .Marland Kitchen... Take one tablet by mouth daily    Warfarin Sodium 6 Mg Tabs (Warfarin sodium) ..... One tablet once daily  Problem # 6:  CARDIOMYOPATHY, ISCHEMIC (ICD-414.8) stable withourt chest pain Her updated medication list for this problem includes:    Losartan Potassium 100 Mg Tabs (Losartan potassium) .Marland Kitchen... Take 1 by mouth once daily    Metoprolol Tartrate 50 Mg Tabs (Metoprolol tartrate) .Marland Kitchen... Take 1 tablet by mouth two times a day    Furosemide 40 Mg Tabs (Furosemide) .Marland Kitchen... 1 by mouth daily    Aspirin 81 Mg Tbec (Aspirin) .Marland Kitchen... Take one tablet by mouth daily    Warfarin Sodium 6 Mg Tabs (Warfarin sodium) ..... One tablet once daily

## 2010-07-30 NOTE — Miscellaneous (Signed)
Summary: Device change out  Clinical Lists Changes  Observations: Added new observation of ICDLEADSTAT4: active (10/18/2009 13:21) Added new observation of ICDLEADSER4: [id number] V (10/18/2009 13:21) Added new observation of ICDLEADMOD4: 5092  (10/18/2009 13:21) Added new observation of ICDLEADDOI4: 05/30/1997  (10/18/2009 13:21) Added new observation of ICDLEADLOC4: RV  (10/18/2009 13:21) Added new observation of ICD IMPL DTE: 10/17/2009  (10/18/2009 13:21) Added new observation of ICD SERL#: [id number] H  (10/18/2009 13:21) Added new observation of ICD MODL#: D224TRK  (10/18/2009 13:21) Added new observation of ICDEXPLCOMM: 10/17/09 [employer]onic Sentry 1601/[id number] H explanted.  (10/18/2009 13:21)       ICD Specifications Following MD:  Sherryl Manges, MD     ICD Vendor:  [employer]     ICD Model Number:  [phone]     ICD Serial Number:  [id number] ICD DOI:  10/17/2009     ICD Implanting MD:  Sherryl Manges, MD  Lead 1:    Location: RA     DOI: 12/06/2004     Model #: 4524     Serial #: [id number]     Status: active Lead 2:    Location: RV     DOI: 12/06/2004     Model #: 7371     Serial #: [id number]     Status: active Lead 3:    Location: LV     DOI: 12/06/2004     Model #: 4194     Serial #: [id number]     Status: active Lead 4:    Location: RV     DOI: 05/30/1997     Model #: 3818     Serial #: [id number]     Status: active  Indications::  ICM, CHF, METAL ALLERGY  Explantation Comments: 10/17/09 [employer]onic Sentry 7893/[id number] H explanted.  ICD Follow Up ICD Dependent:  No      Episodes Coumadin:  Yes  Brady Parameters Mode DDDR     Lower Rate Limit:  60     Upper Rate Limit 120 PAV 130     Sensed AV Delay:  100  Tachy Zones VF:  207     VT:  250     VT1:  167

## 2010-07-30 NOTE — Assessment & Plan Note (Signed)
Summary: PC2/AMD-11:00 APPT   Visit Type:  Follow-up Primary Provider:  Dr.  Lawerance Bach  CC:  "Doing well".  .  History of Present Illness:   [name] is seen in followup for congestive heart failure in the setting of ischemic cardiomyopathy. She is status post CRT-D implantation and notably has a gold device secondary to an allergy to the metal.  The patient denies   chest pain, edema or palpitations   She had had an abandoned but capped ventricular rate sense lead; she also had a 6949-lead. the time of her procedure we ended up pirating the previously implanted rate sense lead and capping the rate sense portion of the 6949 lead  but this ended up coming free from behind the device and began to burn to the skin into the pocket was revised in May  She has undergone  pre-generator replacement Myoview scanning demonstrating an ejection fraction of 36% scarring in the anteroapical anteroseptal region without evidence of significant ischemia. The LV is dilated.   She has had recurrent tachypalpitatoins;  this is not of long standing     Current Medications (verified): 1)  Losartan Potassium 100 Mg Tabs (Losartan Potassium) .... Take 1 By Mouth Once Daily 2)  Metoprolol Tartrate 50 Mg Tabs (Metoprolol Tartrate) .Marland Kitchen.. 1 By Mouth Daily 3)  Simvastatin 40 Mg Tabs (Simvastatin) .... Take One Tablet By Mouth Daily At Bedtime 4)  Furosemide 40 Mg Tabs (Furosemide) .Marland Kitchen.. 1 By Mouth Daily 5)  Aspirin 81 Mg Tbec (Aspirin) .... Take One Tablet By Mouth Daily 6)  Multivitamins  Tabs (Multiple Vitamin) .Marland Kitchen.. 1 By Mouth Daily 7)  Warfarin Sodium 6 Mg Tabs (Warfarin Sodium) .... One Tablet Once Daily  Allergies (verified): 1)  ! * Penicillin 2)  ! * Toradol 3)  ! * Neosporin 4)  ! * Sulfa 5)  ! * Metal 6)  ! * Tetanus Shot  Past History:  Past Medical History: Last updated: 2009/06/20 ischemic cardiomyopathy Congestive heart failure complete heart block device allergy atrial fibrillation  Family  History: Last updated: 06-20-2009 Mother-deceased at age 66. heart failure Father-deceased at age 35. heart failure Sisters-2 both deceased. one at age 33 from heart failure. one at age 48 from heart failure Brothers-6 total. one at age 56 from acute nephritis. one at age 57 from drowning. one at age 1 from heart failure. one at age 66 from heart failure and stroke. 2 brothers still living (one age 23,has had 2 bypasses and has arrythmia) (one age 65,has had heart attacks and strokes). Children-2 deceased. one at age 3months from SIDS. one at age 43 from meningitis and staff infection. 2 still living. 7 grandchildren and 3 great grandchildren  Social History: Last updated: 06/20/2009 smoke-1/2 pack daily alcohol-no widowed lives in [place] 2 children (had 4)  Vital Signs:  Patient profile:   75 year old female Height:      61 inches Weight:      1102 pounds BMI:     208.97 Pulse rate:   83 / minute BP sitting:   118 / 76  (left arm) Cuff size:   regular  Vitals Entered By: Bishop Dublin, CMA (February 25, 2010 10:29 AM)  Physical Exam  General:  The patient was alert and oriented in no acute distress. HEENT Normal.  Neck veins were flat, carotids were brisk.  Lungs were clear.  Heart sounds were regular without murmurs or gallops.  Abdomen was soft with active bowel sounds. There is no clubbing cyanosis  or edema. Skin Warm and dry     ICD Specifications Following MD:  Sherryl Manges, MD     ICD Vendor:  [employer]     ICD Model Number:  [phone]     ICD Serial Number:  [id number] ICD DOI:  10/17/2009     ICD Implanting MD:  Sherryl Manges, MD  Lead 1:    Location: RA     DOI: 12/06/2004     Model #: 4524     Serial #: [id number]     Status: active Lead 2:    Location: RV     DOI: 12/06/2004     Model #: 0867     Serial #: [id number]     Status: active Lead 3:    Location: LV     DOI: 12/06/2004     Model #: 4194     Serial #: [id number]     Status: active Lead 4:     Location: RV     DOI: 05/30/1997     Model #: 8338     Serial #: [id number]     Status: active  Indications::  ICM, CHF, METAL ALLERGY  Explantation Comments: 10/17/09 [employer]onic Sentry 3419/[id number] H explanted.  ICD Follow Up Remote Check?  No Battery Voltage:  3.19 V     Charge Time:  8.6 seconds     Underlying rhythm:  SR ICD Dependent:  No       ICD Device Measurements Atrium:  Amplitude: 2.5 mV, Impedance: 380 ohms, Threshold: 0.75 V at 0.4 msec Right Ventricle:  Amplitude: 20 mV, Impedance: 532 ohms, Threshold: 0.625 V at 0.4 msec Left Ventricle:  Impedance: 437 ohms, Threshold: 0.625 V at 0.4 msec Shock Impedance: 47/62 ohms   Episodes MS Episodes:  81     Percent Mode Switch:  0.6%     Coumadin:  Yes Shock:  0     ATP:  33      Brady Parameters Mode DDDR     Lower Rate Limit:  60     Upper Rate Limit 120 PAV 130     Sensed AV Delay:  100  Tachy Zones VF:  200     VT:  158     VT1:  167     Tech Comments:  Device reprogrammed as above.  ROV 8 weeks with Dr. Graciela Husbands in [place]. Altha Harm, LPN  February 25, 2010 12:04 PM   Impression & Recommendations:  Problem # 1:  SVT/ PSVT/ PAT (ICD-427.0) the patient has had recurrent episodes of tachycardia identified by the device. Many of them were treated by antitachycardia pacing successfully. However, she also has VA dissociation with ventricular tachycardia and the onset of these are all associated with PR prolongation with a short VA interval consistent with a diagnosis of AV node reentry. We discussed treatment options including Augmentin beta blockers which should be appropriate given the fact that she's taking her heart rate is one today, reprogramming of her ICD which I have done with an activation of shocks in a VT-1 zone to allow for rapid pace termination of her SVT, or catheter ablation.  Problem # 2:  IMPLANTABLE  DEFIBRILLATOR MDT CRT (ICD-V45.02) Device parameters and data were reviewed and   changes were made as  above  Problem # 3:  CARDIOMYOPATHY, ISCHEMIC (ICD-414.8) stable on her current medications  Problem # 4:  SYSTOLIC HEART FAILURE, CHRONIC (ICD-428.22) heart failure is well compensated at this point; she is euvolemic The following medications were  removed from the medication list:    Warfarin Sodium 5 Mg Tabs (Warfarin sodium) ..... Use as directed by anticoagulation clinic Her updated medication list for this problem includes:    Losartan Potassium 100 Mg Tabs (Losartan potassium) .Marland Kitchen... Take 1 by mouth once daily    Metoprolol Tartrate 50 Mg Tabs (Metoprolol tartrate) .Marland Kitchen... Take 1 tablet by mouth two times a day    Furosemide 40 Mg Tabs (Furosemide) .Marland Kitchen... 1 by mouth daily    Aspirin 81 Mg Tbec (Aspirin) .Marland Kitchen... Take one tablet by mouth daily    Warfarin Sodium 6 Mg Tabs (Warfarin sodium) ..... One tablet once daily  Problem # 5:  6949 LEAD (ICD-996.04) 6949-lead was then removed from the system and its rate sense portion And repositioned behind her bladder with good healing  Patient Instructions: 1)  Your physician has recommended you make the following change in your medication: INCREASE metprolol two times a day  2)  Your physician recommends that you schedule a follow-up appointment in: 8 weeks.

## 2010-07-30 NOTE — Progress Notes (Signed)
Summary: gold plated device  Phone Note Call from Patient Call back at Home Phone [phone]   Caller: Patient Summary of Call: Comprehensive phone note---  after patient was seen on 09-10-09 and ERI verified through device, [employer] was contacted to obtain gold plated device for patient because of documented titanium allergy.  Princella [employer] and [employer] with [employer] are working on obtaining device, ETA for device delivery is 3 weeks.  As of today, still have not heard definite date for when device will come.  Rudean Curt again today to follow-up.  Pt has appt with Dr Graciela Husbands 10-08-09 in [place] office.  Should definitely be able to schedule procedure at that time. Gypsy Balsam RN BSN  September 18, 2009 11:03 AM

## 2010-07-30 NOTE — Procedures (Signed)
Summary: wound check   Allergies: 1)  ! * Penicillin 2)  ! * Toradol 3)  ! * Neosporin 4)  ! * Sulfa 5)  ! * Metal 6)  ! * Tetanus Shot   ICD Specifications Following MD:  Sherryl Manges, MD     ICD Vendor:  [employer]     ICD Model Number:  [id number]     ICD Serial Number:  [id number] H ICD DOI:  10/17/2009     ICD Implanting MD:  Sherryl Manges, MD  Lead 1:    Location: RA     DOI: 12/06/2004     Model #: 4524     Serial #: [id number]     Status: active Lead 2:    Location: RV     DOI: 12/06/2004     Model #: 9629     Serial #: [id number]     Status: active Lead 3:    Location: LV     DOI: 12/06/2004     Model #: 4194     Serial #: [id number]     Status: active Lead 4:    Location: RV     DOI: 05/30/1997     Model #: 6440     Serial #: [id number]     Status: active  Indications::  ICM, CHF, METAL ALLERGY  Explantation Comments: 10/17/09 [employer]onic Sentry 3875/[id number] H explanted.  ICD Follow Up ICD Dependent:  No      Episodes Coumadin:  Yes  Brady Parameters Mode DDDR     Lower Rate Limit:  60     Upper Rate Limit 120 PAV 130     Sensed AV Delay:  100  Tachy Zones VF:  207     VT:  250     VT1:  167

## 2010-07-30 NOTE — Cardiovascular Report (Signed)
Summary: Pre Op Orders   Pre Op Orders   Imported By: Roderic Ovens 10/03/2009 16:15:17  _____________________________________________________________________  External Attachment:    Type:   Image     Comment:   External Document

## 2010-07-30 NOTE — Assessment & Plan Note (Signed)
Summary: [name] ONLY    Allergies: 1)  ! * Penicillin 2)  ! * Toradol 3)  ! * Neosporin 4)  ! * Sulfa 5)  ! * Metal 6)  ! * Tetanus Shot    ICD Specifications Following MD:  Sherryl Manges, MD     ICD Vendor:  Medtronic     ICD Model Number:  [phone]     ICD Serial Number:  [id number] ICD DOI:  12/06/2004     ICD Implanting MD:  Sherryl Manges, MD  Lead 1:    Location: RA     DOI: 12/06/2004     Model #: 4524     Serial #: [id number]     Status: active Lead 2:    Location: RV     DOI: 12/06/2004     Model #: 9528     Serial #: [id number]     Status: active Lead 3:    Location: LV     DOI: 12/06/2004     Model #: 2725     Serial #: [id number]     Status: active  Indications::  ICM, CHF, METAL ALLERGY   ICD Follow Up Remote Check?  No Battery Voltage:  2.62 V     Charge Time:  11.28 seconds     Battery Est. Longevity:  ERI ICD Dependent:  No      Episodes MS Episodes:  46     Coumadin:  Yes Shock:  0     ATP:  18     Nonsustained:  36     Atrial Pacing:  6.6%     Ventricular Pacing:  99.6%  Brady Parameters Mode DDDR     Lower Rate Limit:  60     Upper Rate Limit 120 PAV 130     Sensed AV Delay:  100  Tachy Zones VF:  207     VT:  250     VT1:  167     Tech Comments:  Longest A-fib 4.1 minutes, total episodes 4 hours.  36NSVT episodes 5-15 beats in length.Optivol and thoracic impedance abnormal 1/28-2/24. 6949 lead stable, SIC  0.     Battery reached ERI 09/08/2009.  We will set her up for an appointment with Dr. Graciela Husbands. Altha Harm, LPN  September 10, 2009 11:17 AM  MD Comments:  Agree with above. She will followup with Dr. Graciela Husbands to discuss ICD generator change and new rate/sense lead.   ICD Specifications Following MD:  Sherryl Manges, MD     Referring MD:  [facility] ICD Vendor:  Medtronic     ICD Model Number:  7299     ICD Serial Number:  [id number] ICD DOI:  12/06/2004     ICD Implanting MD:  Sherryl Manges, MD  Lead 1:    Location: RA     DOI: 12/06/2004     Model #: 4524     Serial  #: [id number]     Status: active Lead 2:    Location: RV     DOI: 12/06/2004     Model #: 8841     Serial #: [id number]     Status: active Lead 3:    Location: LV     DOI: 12/06/2004     Model #: 1093     Serial #: [id number]     Status: active  Indications::  ICM, CHF, METAL ALLERGY   ICD Specs Remote Check?  No Battery Voltage:  2.62 V  Charge Time:  11.28 seconds     ICD Dependent:  No      Episodes MS Episodes:  46     Shock:  0     ATP:  18     Nonsustained:  36     Atrial Pacing:  6.6%     Ventricular Pacing:  99.6%  Brady Parameters  Tachy Zones Tech Comments:  Longest A-fib 4.1 minutes, total episodes 4 hours.  36NSVT episodes 5-15 beats in length.Optivol and thoracic impedance abnormal 1/28-2/24. 6949 lead stable, SIC  0.     Battery reached ERI 09/08/2009.  We will set her up for an appointment with Dr. Graciela Husbands. Altha Harm, LPN  September 10, 2009 11:17 AM  MD Comments:  Agree with above. She will followup with Dr. Graciela Husbands to discuss ICD generator change and new rate/sense lead.

## 2010-08-04 ENCOUNTER — Encounter (INDEPENDENT_AMBULATORY_CARE_PROVIDER_SITE_OTHER): Payer: Self-pay | Admitting: *Deleted

## 2010-08-15 NOTE — Letter (Signed)
Summary: Device-Delinquent Phone Journalist, newspaper, Main Office  [address]   [place], [place] [address]   Phone: [phone]  Fax: [phone]     August 04, 2010 MRN: [id number]   [name] [address] [place], [place]  [address]   Dear Ms. MARKER,  According to our records, you were scheduled for a device phone transmission on 07-25-10.     We did not receive any results from this check.  If you transmitted on your scheduled day, please call us to help troubleshoot your system.  If you forgot to send your transmission, please send one upon receipt of this letter.  Thank you,   Architectural technologist [facility]

## 2010-08-22 ENCOUNTER — Inpatient Hospital Stay: Payer: Self-pay | Admitting: Specialist

## 2010-08-22 ENCOUNTER — Ambulatory Visit: Payer: Self-pay | Admitting: Internal Medicine

## 2010-08-26 ENCOUNTER — Encounter: Payer: Self-pay | Admitting: Internal Medicine

## 2010-08-29 ENCOUNTER — Encounter: Payer: Self-pay | Admitting: Internal Medicine

## 2010-09-16 LAB — SURGICAL PCR SCREEN: MRSA, PCR: NEGATIVE

## 2010-10-07 ENCOUNTER — Telehealth: Payer: Self-pay | Admitting: Internal Medicine

## 2010-10-07 DIAGNOSIS — R609 Edema, unspecified: Secondary | ICD-10-CM

## 2010-10-07 MED ORDER — FUROSEMIDE 40 MG PO TABS: 40.0000 mg | ORAL_TABLET | Freq: Two times a day (BID) | ORAL | Status: DC

## 2010-10-07 MED ORDER — POTASSIUM CHLORIDE CRYS ER 20 MEQ PO TBCR: 20.0000 meq | EXTENDED_RELEASE_TABLET | Freq: Every day | ORAL | Status: AC

## 2010-10-07 NOTE — Telephone Encounter (Signed)
Pt has question re metal in her body. Pt also has question re her condition. Pt would like to talk to a nurse. Pt cell# [phone]

## 2010-10-07 NOTE — Telephone Encounter (Signed)
Spoke w/pt she states she is allergic to metal and on 08/22/10 she has titanium screws placed in her hip, since then she has been having swelling in her ankles right greater than left, she feels it could be from reaction to the screws.  Discussed she has heart failure she states that is not the issue b/c she took extra lasix 2 days last week and it didn't help, she does not weigh herself daily.  She states they are a little in AM but get worse during the day and last night they very swollen, red and very painful, she almost went to ER but she took pain pills and went to bed w/feet elevated and they were better this AM, still swollen though.  Will discuss w/Dr Graciela Husbands and call her back

## 2010-10-07 NOTE — Telephone Encounter (Signed)
Pt's optivol is very elevated, per Dr Graciela Husbands increase lasix to 40mg  bid for 2 weeks, start potassium daily, bmet in 1 week and carelink in 2 weeks.  Pt is aware new rx for lasix and potassium sent to pharmacy, she will go to [facility] next [place] for labs and will send another transmission in 2 weeks

## 2010-10-07 NOTE — Telephone Encounter (Signed)
Per Dr Graciela Husbands have pt send in a transmission pt is aware and will send one in, we will evaluate and call her back

## 2010-10-14 ENCOUNTER — Other Ambulatory Visit (INDEPENDENT_AMBULATORY_CARE_PROVIDER_SITE_OTHER): Payer: Medicare Other | Admitting: *Deleted

## 2010-10-14 ENCOUNTER — Other Ambulatory Visit: Payer: Self-pay | Admitting: Internal Medicine

## 2010-10-14 DIAGNOSIS — R609 Edema, unspecified: Secondary | ICD-10-CM

## 2010-10-15 ENCOUNTER — Encounter: Payer: Self-pay | Admitting: *Deleted

## 2010-10-15 ENCOUNTER — Telehealth: Payer: Self-pay | Admitting: Internal Medicine

## 2010-10-15 LAB — BASIC METABOLIC PANEL
BUN: 24 mg/dL — ABNORMAL HIGH (ref 6–23)
Calcium: 9.6 mg/dL (ref 8.4–10.5)
Creat: 1.06 mg/dL (ref 0.40–1.20)
Glucose, Bld: 90 mg/dL (ref 70–99)
Potassium: 4 mEq/L (ref 3.5–5.3)
Sodium: 139 mEq/L (ref 135–145)

## 2010-10-15 NOTE — Telephone Encounter (Signed)
PT C/O NO CHANGE IN SWELLING SINCE INCREASE OF LASIX PER DR Graciela Husbands HAVE PT TAKE LASIX 40 MG 2 TABS QOD  ALT WITH 40 MG QOD AND WILL SEE PT  EITHER TUES OR WED IN [place] OFF./ PT AWARE  WILL CALL PT TOM AND SCHED F/U APPT.CY

## 2010-10-16 NOTE — Telephone Encounter (Signed)
PT AWARE APPT WITH DR Graciela Husbands MADE FOR 10/22/10 AT 2:45 PM  IN [place] OFFICE./CY

## 2010-10-21 ENCOUNTER — Encounter: Payer: Self-pay | Admitting: *Deleted

## 2010-10-22 ENCOUNTER — Ambulatory Visit (INDEPENDENT_AMBULATORY_CARE_PROVIDER_SITE_OTHER): Payer: Medicare Other | Admitting: Internal Medicine

## 2010-10-22 ENCOUNTER — Encounter: Payer: Self-pay | Admitting: Internal Medicine

## 2010-10-22 DIAGNOSIS — I499 Cardiac arrhythmia, unspecified: Secondary | ICD-10-CM

## 2010-10-22 DIAGNOSIS — I442 Atrioventricular block, complete: Secondary | ICD-10-CM

## 2010-10-22 DIAGNOSIS — I471 Supraventricular tachycardia: Secondary | ICD-10-CM

## 2010-10-22 DIAGNOSIS — I5023 Acute on chronic systolic (congestive) heart failure: Secondary | ICD-10-CM

## 2010-10-22 DIAGNOSIS — Z95810 Presence of automatic (implantable) cardiac defibrillator: Secondary | ICD-10-CM | POA: Insufficient documentation

## 2010-10-22 DIAGNOSIS — I5022 Chronic systolic (congestive) heart failure: Secondary | ICD-10-CM

## 2010-10-22 LAB — BASIC METABOLIC PANEL
BUN: 32 mg/dL — ABNORMAL HIGH (ref 6–23)
CO2: 24 mEq/L (ref 19–32)
Chloride: 103 mEq/L (ref 96–112)
Creat: 1.19 mg/dL (ref 0.40–1.20)
Glucose, Bld: 98 mg/dL (ref 70–99)
Potassium: 4.8 mEq/L (ref 3.5–5.3)
Sodium: 138 mEq/L (ref 135–145)

## 2010-10-22 NOTE — Patient Instructions (Addendum)
STOP Aspirin. We will send you a letter with your lab results that were drawn today. If abnormal we will call you. INCREASE Lasix to 80mg  once daily for 2 WEEKS, then return to taking Lasix 40mg  every other day, alternating with 80mg . Your physician recommends that you schedule a follow-up appointment in: 6 months with Dr. Jeremy Johann in 3 months.

## 2010-10-22 NOTE — Assessment & Plan Note (Signed)
He should develop post operative heart failure manifested by optivol increase as well as peripheral edema and dyspnea. This has been markedly improved with diuresis. We will need to assess her renal function at this time and we will continue with her diuretics.  This fails to answer the question however that happened postoperatively. Currently she was on end-stage. This could contribute to worsening renal function. We will check this today. Another possibility is that she had a perioperative myocardial event. We will need to look at left ventricular function. I will defer this to Dr. Ludwig Lean

## 2010-10-22 NOTE — Assessment & Plan Note (Signed)
Patient has recurrent episodes of AV nodal reentry that her terminated effectively by her defibrillator

## 2010-10-22 NOTE — Assessment & Plan Note (Signed)
The patient's device was interrogated.  The information was reviewed. No changes were made in the programming.    

## 2010-10-22 NOTE — Progress Notes (Signed)
HPI  [name] is a 75 y.o. female  seen in followup for congestive heart failure in the setting of ischemic cardiomyopathy. She is status post CRT-D implantation and notably has a gold device secondary to an allergy to the metal.  The patient denies   chest pain, edema or palpitations   She had had an abandoned but capped ventricular rate sense lead; she also had a 6949-lead. the time of her procedure we ended up pirating the previously implanted rate sense lead and capping the rate sense portion of the 6949 lead  but this ended up coming free from behind the device and began to burn to the skin into the pocket was revised in May  She has undergone  pre-generator replacement Myoview scanning demonstrating an ejection fraction of 36% scarring in the anteroapical anteroseptal region without evidence of significant ischemia. The LV is dilated.  In February, she so. She thought that she had pulled a muscle but occurred out in 5 days later when she went to the emergency room she found that she had fractured a hip. She underwent pinning. Post procedurally following discharge  ishe developed significant swelling in her thighs calves and feet. She thought it was related to her surgery. She calledDr Annice Needy  s office and was seen there. She was told to increase her fluid pills. She then called [place]. We interrogated her device remotely and found her optivol is markedly increased. We changed her Lasix and there is a marked improvement in her fluid with loss of swelling in her thighs and proximal calves. She continued to have swelling in her feet. There has been significant associated pain. SHe has had no chest pain.   She has had recurrent tachypalpitatoins;  this is  long standing   Past Medical History  Diagnosis Date  . Atrioventricular block, complete   . Chronic systolic heart failure   . Other specified forms of chronic ischemic heart disease   . Paroxysmal a-fib   . CHF (congestive heart failure)      Past Surgical History  Procedure Date  . Insert / replace / remove pacemaker     Implantable Defibrillator MDT CRT    Current Outpatient Prescriptions  Medication Sig Dispense Refill  . alendronate (FOSAMAX) 70 MG tablet Take 70 mg by mouth every 7 (seven) days. Take with a full glass of water on an empty stomach.       Marland Kitchen aspirin 81 MG EC tablet Take 81 mg by mouth daily.        . ferrous gluconate (FERGON) 325 MG tablet Take 325 mg by mouth daily with breakfast.        . furosemide (LASIX) 40 MG tablet Take 40 mg by mouth as directed. 80mg  every other day and 40mg  alternating.       Marland Kitchen losartan (COZAAR) 100 MG tablet Take 100 mg by mouth daily.        . metoprolol (TOPROL-XL) 50 MG 24 hr tablet Take 50 mg by mouth 2 (two) times daily.       . potassium chloride SA (K-DUR,KLOR-CON) 20 MEQ tablet Take 1 tablet (20 mEq total) by mouth daily.  30 tablet  6  . simvastatin (ZOCOR) 40 MG tablet Take 40 mg by mouth at bedtime.        Marland Kitchen warfarin (COUMADIN) 6 MG tablet Take 6 mg by mouth daily.        Marland Kitchen DISCONTD: furosemide (LASIX) 40 MG tablet Take 1 tablet (40 mg  total) by mouth 2 (two) times daily.  60 tablet  6  . Multiple Vitamin (MULTIVITAMIN) tablet Take 1 tablet by mouth daily.          Allergies  Allergen Reactions  . Ketorolac Tromethamine   . Penicillins   . Sulfonamide Derivatives   . Tetanus Toxoid   . Triple Antibiotic     Review of Systems negative except from HPI and PMH  Physical Exam Well developed and well nourished in no acute distress HENT normal E scleral and icterus clear Neck Supple JVP 6-7 cm; carotids brisk and full Clear to ausculation Regular rate and rhythm, no murmurs gallops or rub Soft with active bowel sounds No clubbing cyanosis; mild edema over the dorsum and the distal legs bilaterally approximately 1+ Alert and oriented, grossly normal motor and sensory function Skin Warm and Dry     Assessment and  Plan

## 2010-10-27 ENCOUNTER — Encounter: Payer: Self-pay | Admitting: *Deleted

## 2010-11-07 ENCOUNTER — Other Ambulatory Visit (INDEPENDENT_AMBULATORY_CARE_PROVIDER_SITE_OTHER): Payer: Medicare Other | Admitting: *Deleted

## 2010-11-07 DIAGNOSIS — I471 Supraventricular tachycardia, unspecified: Secondary | ICD-10-CM

## 2010-11-07 DIAGNOSIS — Z79899 Other long term (current) drug therapy: Secondary | ICD-10-CM

## 2010-11-07 DIAGNOSIS — I442 Atrioventricular block, complete: Secondary | ICD-10-CM

## 2010-11-07 DIAGNOSIS — I5022 Chronic systolic (congestive) heart failure: Secondary | ICD-10-CM

## 2010-11-07 LAB — BASIC METABOLIC PANEL: Glucose, Bld: 110 mg/dL — ABNORMAL HIGH (ref 70–99)

## 2010-11-12 NOTE — Assessment & Plan Note (Signed)
[facility]                         ELECTROPHYSIOLOGY OFFICE NOTE   NAME:[name], [name]                            MRN:          [id number]  DATE:03/23/2007                            DOB:          1935/07/21    [name] is seen following CRT-D implantation in the setting of ischemic  heart disease.  She remains very well compensated without complaints of  chest pain or shortness of breath.   She does have a 6949 lead in and has had no problems.   Her medications are reviewed and are notable for intercurrent down  titration of her Lanoxin to 0.125 q.o.d.   EXAMINATION:  Her blood pressure today was 98/56 with a pulse of 67.  LUNGS:  Clear.  HEART SOUNDS:  Regular.  EXTREMITIES:  Without edema.  SKIN:  Warm and dry.   Interrogation of her Medtronic ICD demonstrates a P wave of 2.8 with an  impedance of 384, a threshold in all 3 chambers of 1 volt at 0.2.  The R  wave was 19.0 with an impedance of 528 and the LV impedance was 400.  There was no intercurrent episode and she was ventricularly paced 100%  of the time.   IMPRESSION:  1. Ischemic cardiomyopathy.  2. Congestive heart failure in the setting of number 1.  3. Status post CRT-D, now Class II.  4. Device dependence.  5. Coumadin therapy for previous transient ischemic attack.   [name] is stable.  We will see her again in 1 year's time.  I should  note that her 6949 lead had the new software added today.   We will maintain her on CareLink.     Duke Salvia, MD, [facility]  Electronically Signed    SCK/MedQ  DD: 03/23/2007  DT: 03/23/2007  Job #: [id number]   cc:   Einar Crow, Dr.  Arnoldo Hooker, Dr.

## 2010-11-12 NOTE — Letter (Signed)
June 16, 2008    Arnoldo Hooker, MD  [address]  [place], [place], [address]   RE:  [name], [name]  MRN:  [id number]  /  DOB:  1936/05/16   Dear Smitty Cords:   It was good talking to you today, and again wishes for a [facility]  Christmas.   Ms. Gibas, as I mentioned was having some shortness of breath at night, a  little bit of exercise intolerance, but in the setting of her grieving  over the loss of her husband, it is a little bit hard to tell, as you  gathered the other day.   She is not having nocturnal dyspnea.  She is having no chest discomfort.   MEDICATIONS:  1. Avapro 150.  2. Toprol 50.  3. Furosemide 40.  4. Warfarin.  5. Aspirin.  6. Lanoxin 0.125.  7. __________ 80.   PHYSICAL EXAMINATION:  VITAL SIGNS:  Her blood pressure is 125/67, which  is up a little bit; her pulse is 71, and her weight is 106, which is up  6 pounds over the last year.  LUNGS:  Clear.  NECK:  Neck veins, however, were 7-8 cm.  CARDIAC:  Heart sounds were regular without murmurs or gallops.  ABDOMEN:  Soft.  EXTREMITIES:  No edema.   Interrogation of her Medtronic InSync Sentry device demonstrates a P  wave of 2.4 with impedance of 19.9.  RV impedance of 464 and LV  impedance of 336.  Threshold was 1 volt at 0.2 in all 3 chambers.  Battery voltage is 2.92.  She had 4 episodes of ATP.  Electrograms of  which were probably most consistent with AV nodal reentry, although the  initiation is not so consistent with that.  The episodes were terminated  by ATP.   IMPRESSION:  1. Ischemic cardiomyopathy.  2. Congestive heart failure - acute on chronic manifested by increased      OptiVol and mild increase in shortness of breath and jugular venous      distention.  3. Status post cardiac resynchronization therapy defibrillator for the      above.  4. Supraventricular tachycardia treatment with antitachycardia pacing,      probably atrioventricular nodal reentry.  5. Intercurrent stress related  to the death of her husband.  6. Device allergy with the __________ device.   Ms. Jeanifer, [place], has a variety of issues ongoing as we have talked about  before.  Per your suggestion, I ended up increasing her Lasix to 40  b.i.d.  She is to call your office on Monday.  We will check her OptiVol  via CareLink in 1 month and I will forward those data to you.   As relates to her SVT, the fact that it is terminated with  antitachycardia pacing without clinical sequelae, I suggest that we are  probably just as well leaving it alone.   Again, I hope you have a Merry Christmas.    Sincerely,      Duke Salvia, MD, [facility]  Electronically Signed    SCK/MedQ  DD: 06/16/2008  DT: 06/17/2008  Job #: [id number]

## 2010-11-15 NOTE — H&P (Signed)
NAMECARMELINE, [name] NO.:  [id number]   MEDICAL RECORD NO.:  [id number]          PATIENT TYPE:  INP   LOCATION:  2899                         FACILITY:  [facility]   PHYSICIAN:  Duke Salvia, M.D.  DATE OF BIRTH:  [birth year]   DATE OF ADMISSION:  12/02/2004  DATE OF DISCHARGE:                                HISTORY & PHYSICAL   CARDIOLOGIST:  Arnoldo Hooker, M.D.   PRIMARY CARE PHYSICIAN:  Dr. Dareen Piano.   ELECTROPHYSIOLOGIST:  Duke Salvia, M.D.   ALLERGIES:  PENICILLIN.  SULFA.  TORADOL.  NEOSPORIN.  ALL METALS.  TETANUS.   HISTORY OF PRESENT ILLNESS:  Ms. [name] is a 75 year old female who had a  history of myocardial infarction in 1998.  Her anginal symptoms were left  jaw pain and left neck pain and let shoulder radiation.  She was found to  have severe three-vessel coronary artery disease at left heart  catheterization.  Unfortunately she had a very large retroperitoneal bleed  post catheterization which required evacuation and right common femoral  artery repair.  She underwent subsequent coronary artery bypass graft  surgery with mitral valve annuloplasty in March 1998 by Dr. Kathlee Nations Trigt.  In the postoperative convalescence she had congestive heart failure and  symptomatic bradycardia but the pacemaker was postponed until eventual  implantation in December 1998.  She has a history of extracranial  cerebrovascular occlusive disease and she is monitored on a regular basis  for her left internal carotid artery stenosis.  [name] had increasing  claudication symptoms affecting her in the year 2004.  An aortogram was done  in September of that year and showed severe stenosis at the origin of the  right superficial femoral artery.  She is status post repair of the right  common femoral artery with Hemashield patch angioplasty.  The patient has  right bundle branch block pattern with V-pacing, wide QRS of 220 msec.  The  patient has II-III congestive heart  failure symptoms.  She can not walk one-  quarter mile without stopping and in stores with her husband she must rest  frequently when walking.  The patient also has cardiac risk equivalents  which include very strong family history of premature coronary artery  disease.  She has lost four siblings in this manner.  She has hypertension.  Dyslipidemia.  Known cardiac risk equivalents include of course history of  myocardial infarction in March 1998 and known three-vessel coronary artery  disease .  She also has extracranial cerebrovascular occlusive disease with  left internal artery stenosis and a history of TIA, one episode, in which  her left arm locked up around a bottom a shampoo and she was unable to move  the fingers of the left hand to free herself of this for several minutes.  She has ischemic cardiomyopathy with an ejection fraction of 30% by  echocardiogram in January 2006.  The patient denies any prior history of  diabetes, deep venous thrombosis or pulmonary embolus.  No history of GI  bleed, no overt cerebrovascular accident.   CURRENT  MEDICATIONS:  1.  Avapro 150 mg daily.  2.  Allopurinol 300 mg daily.  3.  Lanoxin 0.125 mg daily.  4.  Toprol XL 50 mg daily.  5.  Spironolactone 25 mg daily.  6.  Lasix 40 mg daily.  7.  Vytorin 10/40 one tab daily at bedtime.  8.  Coumadin 3.5 mg daily.  9.  Boniva 150 mg tablet monthly.  10. Calcium with Vitamin D two tabs daily.  11. Aspirin 325 mg daily.   SOCIAL HISTORY:  The patient lives in [place] with her husband.  She  actively works as a Conservation officer, nature at [employer].  She does not smoke having quit in  1998.  She does not partake in alcoholic beverages.   FAMILY HISTORY:  Mother died at age 13, she had coronary artery disease.  Father died at age 25, he suffered from congestive heart failure symptoms  but died of a ruptured abdominal aorta.  She has two brothers who are  predeceased, one of a myocardial infarction at age 45 and  another one with a  myocardial infarction age 16.  She has two sisters who are predeceased, one  with a MI at age 64, one with a MI at age 32.  She has two brothers living,  both of whom who have experienced myocardial infarctions.   REVIEW OF SYSTEMS:  The patient does not have any fever or chills, night  sweats or weight change in the last six months.  HEENT:  No nasal discharge,  no epistaxis, no hoarseness, no vertigo, no photophobia, no hearing loss.  Integument.  No rashes, no nonhealing ulcerations.  Cardiopulmonary:  The  patient is not having chest pain, in fact has never had chest pain as an  anginal symptom.  The patient is short of breath especially with exertion.  She props herself up at night to sleep on a lounge pillow.  She is not  complaining of paroxysmal nocturnal dyspnea.  She has edema by the end of  the day, swelling at the left ankle.  This is the leg where the saphenous  vein was harvested.  She has never had history of syncope or presyncope.  She has claudication symptoms which were resolved in the year 2004 but had  seemed to return recently.  She has a history of palpitations which are  experienced as heart racing and are transient.  Urogenital:  No frequency,  urgency or dysuria.  Psychologic:  No depression or anxiety.  She did have  numbness in the left arm at the time of that one TIA.  Gastrointestinal:  No  nausea, vomiting or diarrhea.  No melena, no abdominal pain, no GERD.  Endocrine:  She has cold intolerance which she and her husband ascribe to  the fact that she is on Coumadin.  They say that all the people that they  know who are on Coumadin are either heat intolerant or cold intolerance.  Musculoskeletal:  Arthralgias involving the left foot where she has gout  localized.  No joint swelling, deformity or pain.  All other systems are  negative.  She claims to have no persistent neurologic deficit.  PHYSICAL EXAMINATION:  VITAL SIGNS:  Temperature  97.0, pulse 63 and regular,  blood pressure 94/46, respirations 20.  She weighs 122 pounds.  GENERAL:  She is alert and oriented x3.  In no acute distress.  A good  historian.  HEENT:  Normocephalic, atraumatic.  Eyes:  Pupils equal, round, and reactive  to light.  Extraocular movements intact.  Sclerae are anicteric.  The nares  are without discharge.  NECK:  Supple.  There is a marked left carotid bruit.  No bruit on the  right.  She has mild jugular venous distention and no thyromegaly.  HEART:  Regular rate and rhythm with S1 and S2 clearly auscultated.  There  is no murmur.  The PMI is nondisplaced.  LUNGS:  Clear to auscultation and percussion bilaterally with inspiratory  wheeze.  ABDOMEN:  Soft, nondistended, bowel sounds  are present.  No rebound or  guarding, no hepatosplenomegaly.  The abdominal aorta is nonpulsatile.  EXTREMITIES:  Show no evidence of clubbing, cyanosis, or edema.  The  dorsalis pedis pulse on the right is dampened and all but absent.  The  dorsalis pedis pulse on the left is palpable 4/4.  The posterior tibial  pulse on the right is palpable.  Radial pulses bilaterally 4/4.  She has a  midline sternotomy which is well-healed and the left lower extremity  saphenous vein graft harvest sites are well-healed.  She has no  musculoskeletal, no joint deformities or effusions, kyphosis or scoliosis.  NEUROLOGIC:  Alert and oriented x3.  Cranial nerves II-XII are grossly  intact.  Grip strength is 5/5 with both lower extremities.   DIAGNOSTIC STUDIES:  Electrocardiogram Oct 31, 2004 shows V-pacing with QRS  of 220 msec and left ventricular hypertrophy.  PT on June 5 19.5, INR 2.1,  PTT 335.  Complete blood counts white cells are 9, hemoglobin 11.3,  hematocrit 33.9, platelets 228.  Serum electrolytes sodium 134, potassium  4.7, chloride 106, bicarbonate 22, BUN 43, creatinine 1.5, glucose 114.   ASSESSMENT:  1.  Admitted for pacer upgrade which is at end-of-life to  a BiV/ICD.  2.  History of myocardial infarction with retroperitoneal hematoma status      post left heart catheterization requiring evacuation and repair right      common femoral artery.  3.  Three-vessel coronary artery disease  status post Coronary artery bypass      graft surgery in March 1998 with mitral valve angioplasty Dr. Kathlee Nations      Trigt.  4.  History of transient ischemic attack on chronic Coumadin therapy.  5.  Pacer implantation December 1998 for severe bradycardia which was      initially diagnosed at the time of her cardiac artery bypass graft      surgery and thought to improve with revascularization.  6.  Claudication status post repair of the right common femoral artery      September 2004 with Hemashield patch angioplasty.  7.  On chronic Coumadin therapy for extracranial cerebrovascular occlusive      disease.  The patient has left internal carotid artery stenosis which is      monitored. 8.  History of transient ischemic attack.  9.  Very strong family history of premature coronary artery disease.  10. Hypertension.  11. Dyslipidemia.  12. Gout in the left foot.  13. Osteoporosis.  14. Class II-III congestive heart failure symptoms.  15. Wide QRS of 220 msec.   PAST SURGICAL HISTORY:  1.  Status post hysterectomy.  2.  Status post left breast biopsy x2 both benign.  3.  Status post bilateral carpal tunnel release.   PLAN:  The patient will have pacer upgraded to BiV/ICD.  She requires gold-  plated device because of allergies to all metals, however, this device is  not currently ready.  Also her INR is  2.1 today.  She will probably return  later this week for device implantation with left ventricular lead  placement.      GM/MEDQ  D:  12/02/2004  T:  12/02/2004  Job:  [id number]

## 2010-11-15 NOTE — Discharge Summary (Signed)
[name], [name]                   ACCOUNT NO.:  [id number]   MEDICAL RECORD NO.:  [id number]          PATIENT TYPE:  INP   LOCATION:  2039                         FACILITY:  [facility]   PHYSICIAN:  Duke Salvia, M.D.  DATE OF BIRTH:  [birth year]   DATE OF ADMISSION:  12/06/2004  DATE OF DISCHARGE:  12/07/2004                                 DISCHARGE SUMMARY   DISCHARGE DIAGNOSES:  1.  Discharging day #1, status post implantation of Medtronic InSync Century      cardioverter defibrillator with left ventricular lead augmentation.  2.  Ischemic cardiomyopathy, ejection fraction 30%.  3.  Class II-III congestive heart failure symptoms.  4.  History of myocardial infarction with subsequent coronary artery bypass      graft surgery in March 1998 combined with mitral valve annuloplasty.  5.  History of transient ischemic attack on Coumadin.  6.  Strong family history of coronary artery disease.  7.  Status post pacer implantation December 1998 for severe bradycardia.   SECONDARY DIAGNOSES:  1.  Hypertension.  2.  Dyslipidemia.  3.  Gout to the left foot.  4.  Intracranial cerebral vascular occlusive disease with history of left      internal carotid artery stenosis monitored.  5.  History of retroperitoneal hematoma status post coronary artery bypass      graft surgery with evacuation and repair of right femoral artery.  6.  Claudication status post repair of right common femoral artery September      2004 with Hemashield patch angioplasty.  7.  Hysterectomy, status post left breast biopsy x2, both benign, bilateral      carpal tunnel release.   PROCEDURE:  December 06, 2004 explantation of existing transvenous permanent  pacemaker with implantation of Medtronic InSync Century cardioverter  defibrillator with left ventricular lead augmentation, Dr. Sherryl Manges.  There was defibrillator threshold study, but I do not see any evidence of  defibrillator threshold study.   DISCHARGE  DISPOSITION:  Discharging day #1 after implantation of Medtronic  cardioverter defibrillator, she has had no further cardiac dysrhythmias or  respiratory distress.  She is in sinus rhythm at the time of discharge.  Her  incision has been inspected and found to be without swelling, erythema or  drainage.  Chest x-ray has been examined and leads are appropriate and  device has been interrogated by Med[employer] representatives with all values  within normal limits.  The patient has been given a mobility sheet to  discuss movement of the left upper extremity for the next week.  The patient  is asked to keep her incision dry for the next seven days.   DISCHARGE MEDICATIONS:  1.  Avapro 150 mg daily.  2.  Allopurinol 300 mg daily.  3.  Lanoxin 0.125 mg daily.  4.  Toprol-XL 50 mg daily.  5.  Spironolactone 25 mg daily.  6.  Lasix 40 mg daily.  7.  Vytorin 10/40 daily at bedtime.  8.  Coumadin 3.5 mg daily to start December 07, 2004.  9.  Boniva monthly as before this hospitalization.  10. Calcium with vitamin D two tabs daily.  11. Aspirin 325 mg daily for pain.  She has a prescription for Darvocet N      100 one to tabs every three to four hours as needed for pain.   DISCHARGE DIET:  Low sodium, low cholesterol diet.   She is asked to keep the incision dry over the next seven days.  The office  of [facility] will call with followup appointments.  She is also to  follow up her Coumadin with Dr. Gwen Pounds in the next week, week of June 12.   BRIEF HISTORY:  Ms. [name] is a 75 year old female.  She has a history of  myocardial infarction, severe three vessel coronary artery disease.  She is  status post coronary artery bypass graft surgery with mitral valve  annuloplasty in March of 1998.  Subsequent to her bypass, she had an episode  of congestive heart failure with symptomatic bradycardia requiring pacemaker  implantation in December 1998.  She had immediate postop retroperitoneal   hematoma which required evacuation.  There was some question of damage to  the right common femoral artery.  She had right common femoral artery repair  with Hemashield patch angioplasty subsequently in September 2004 for  claudication.  The patient has right bundle branch block with wide paced QRS  of 220 ms.  The patient has class II-III congestive heart failure.  She had  an echocardiogram showing ejection fraction of 30% in January 2006.  The  patient can walk one-quarter mile without stopping to rest in stores.  She  must rest frequently when walking.  The patient qualifies for a cardioverter  defibrillator under MADIT II studies, qualifies for BiV upgrade to her  pacemaker under the COMPANION trial.   HOSPITAL COURSE:  The patient presented electively on December 06, 2004 for  explantation of her existing pacemaker and implantation of cardioverter  defibrillator.  The procedure went well with no postoperative complications.  The patient discharges on her preoperative medications.  She is to start  Coumadin on June 10 and she will follow up with our office in 10-14 days  with a basic metabolic panel and incision check.  She will see Dr. Graciela Husbands in  eight weeks, September 2006.      Debbora Lacrosse   GM/MEDQ  D:  12/06/2004  T:  12/07/2004  Job:  [id number]   cc:   Arnoldo Hooker, MD  [place], [place]   Dr. Dareen Piano

## 2010-11-15 NOTE — Assessment & Plan Note (Signed)
[facility]                           ELECTROPHYSIOLOGY OFFICE NOTE   NAME:FOXTolulope, Pinkett                            MRN:          [id number]  DATE:03/10/2006                            DOB:          25-May-1936    HISTORY OF PRESENT ILLNESS:  Mrs. Truxillo is seen for her annual device  followup.  She has ischemic heart disease and is status post gold-plated CRT  upgraded device about 1-1/2 years ago.  She continues to do terrific from a  cerebrovascular accident point-of-view.  She is not sleeping and she has a  really quite flat, almost sad, affect.  Her medications were reviewed and  include beta blockers, ARB's, Coumadin, and aspirin.  Her aspirin though is  notably 325 mg.   PHYSICAL EXAMINATION:  VITAL SIGNS:  Her blood pressure was 124/63, pulse of  69.  LUNGS:  Clear.  HEART:  Regular heart sounds.  EXTREMITIES:  Without edema.   Interrogation of her Medtronic Symphony [id number] device demonstrates that she  has no atrial or ventricular intrinsic rhythm.  Her atrial impendence was  400, with a threshold at 1 volt of 0.2.  RV impedence  616, threshold 1volt  0.3, LV impedence 440, threshold at 1 volt of 0.2  There is dependent.  There is no intercurrent therapy.  __________ Has been bouncing up and down  a little bit.   IMPRESSION:  1. Congestive heart failure in the setting of ischemic heart disease.  2. Status post CRT ICD implantation for class III, now class II congestive      heart failure.  3. Device dependence.  4. Sleep disturbance.   Mrs. Marrin is doing well from a cardiovascular view.  I have asked that she  follow up with Dr. Dareen Piano about her sleep problems and maybe a sleep  referral would be of value.   I have also asked her to follow up with Dr. Wayna Chalet concerning her  aspirin dose and asked whether she would not be appropriately down-dosed to  81 to 165 mg a day.   We will see her again in one years' time and continue  monitoring.                                   Duke Salvia, MD, [facility]   SCK/MedQ  DD:  03/10/2006  DT:  03/10/2006  Job #:  [id number]   cc:   Trish Fountain, MD  Einar Crow, MD

## 2010-11-15 NOTE — Discharge Summary (Signed)
NAMEYEZENIA, FREDRICK NO.:  [id number]   MEDICAL RECORD NO.:  [id number]           PATIENT TYPE:   LOCATION:  2039                         FACILITY:  [facility]   PHYSICIAN:  Olga Millers, M.D. [facility] OF BIRTH:  February 11, 1936   DATE OF ADMISSION:  12/06/2004  DATE OF DISCHARGE:  12/07/2004                                 DISCHARGE SUMMARY   PRIMARY CARE PHYSICIAN:  Dr. Dareen Piano in [place], [place].   CARDIOLOGIST:  Duke Salvia, MD.   DISCHARGE MEDICATIONS:  Ms. Hunton is being discharged home with the following  medications.  1.  Avapro 150 mg daily.  2.  Allopurinol 300 mg daily.  3.  Lanoxin 0.125 mg daily.  4.  Toprol-XL 50 mg daily.  5.  Spironolactone 25 mg daily.  6.  Lasix 40 mg daily.  7.  Vytorin 10/40 mg at bedtime.  8.  Coumadin 3.5 mg daily.  Patient to resume Coumadin on December 07, 2004.  9.  Boniva monthly.  10. Calcium with vitamin D 2 tablets daily.  11. Aspirin 325 mg daily.   PAIN MANAGEMENT:  Tylenol 1 to 2 tablets every 4 to 6 hours, or Darvocet N-  100 1 to 2 tablets every 3 to 4 hours.   ACTIVITY:  Per supplemental discharge instructions per defibrillator  patients of [facility].   DIET:  Low-sodium, low-cholesterol.   WOUND CARE:  Please keep incision dry for the next 7 days.  Sponge bath  until Friday, June 16th.   FOLLOWUP APPOINTMENT:  Our office will call with followup appointments.  Follow up Coumadin with Dr. Amador Cunas the week of June the 12th.   HOSPITAL COURSE:  The patient admitted for procedure.  The patient had  previously implanted pacemaker now at end-of-life, status post revision to  VVI mode:  METAL ALLERGY; ischemic cardiomyopathy with congestive heart  failure; tortuosity of the innominate vein by Dr. Berton Mount on December 06, 2004.  The patient tolerated the procedure without complications.  Patient  being discharged home after being seen by Dr. Olga Millers on day of  discharge pending review  of PA and lateral chest x-ray.  Patient will need a  B-MET drawn in two weeks, also.  This can be done at [facility] or  patient's primary care physician.   LABORATORY DATA AT DISCHARGE:  Potassium 4.4, BUN 30, creatinine 1.3.   PROCEDURE:  Patient being discharged home status post implant of Medtronic  InSync Century 7299 gold defibrillator, serial [id number].      Mic   MB/MEDQ  D:  12/07/2004  T:  12/07/2004  Job:  [id number]

## 2010-11-15 NOTE — Op Note (Signed)
NAMELENNYX, VERDELL NO.:  [id number]   MEDICAL RECORD NO.:  [id number]          PATIENT TYPE:  INP   LOCATION:  2039                         FACILITY:  [facility]   PHYSICIAN:  Duke Salvia, M.D.  DATE OF BIRTH:  [birth year]   DATE OF PROCEDURE:  12/06/2004  DATE OF DISCHARGE:                                 OPERATIVE REPORT   PREOPERATIVE DIAGNOSIS:  Previously implanted pacemaker now at end-of-life,  status post reversion to VVI mode; metal allergy; ischemic cardiomyopathy  with congestive heart failure.   POSTOPERATIVE DIAGNOSIS:  Previously implanted pacemaker now at end-of-life,  status post reversion to VVI mode, metal allergy, ischemic cardiomyopathy  with congestive heart failure; tortuosity of the innominate vein.   We obtained informed consent.  The patient was brought to the  electrophysiology laboratory and placed on the fluoroscopic table in the  supine position.  After routine prep and drape, previously undertaken  venogram demonstrating patency of the extrathoracic and innominate veins,  lidocaine was infiltrated along the medical aspect of the incision and  carried down to the layer of the prepectoral fascia.  Care was made to avoid  the previously __________.  Care was undertaken to avoid the leads that were  exposed as there had been caudal migration of the pacemaker.  At this point  attention was turned to gain access of the extrathoracic.  Left subclavian  vein which turned out to be relatively easy, but.   Following insertion of the first wire, we noticed that it would not pass  past the junction of the innominate and the superior vena cava.  I then put  in another 0.035 wire at this point because I did not want to have to stick  with potentially a Glide wire in place.  We thus had two 0.035 wires in  place.  I put a small dilator sheath in which allowed for transfer of the  0.035 to a Glide wire.  This would also not pass.  This was then  our first  venogram.  It was clearly evident that dye passed from the innominate to the  subclavian vein.  It was also evidence there was some stenosis here as there  was collateral filling, but the collateral filling was secondary.   We then moved the angles of the cameras, and we were able to demonstrate  that there was in fact an S turn of tortuosity with the vein going caudal,  cephalad, and then caudal again.  We then utilized a Whisper 0.014 wire to  maneuver past this area.  This demonstrated the patency and was hopefully  helping to straighten it out a little bit.  However, it did not straighten  it adequately in such that a Glide wire even angled could not be manipulated  past this site.   We spent some time at this juncture (pun) and ultimately this strategy that  was successful was as follows.  A transit catheter was placed over the 0.014  Whisper wire.  A mailman wire was then passed through the transit catheter  to  secure access into the superior vena cava/right atrium.  At this point,  the angled Glide wire could still not pass.  We then took a Medtronic Attain  Select sheath and passed it over the mailman wire and it passed into the  superior vena cava and down to the junction to the right atrium.  Through  this select sheath was then placed an Amplatz 0.038 wire.  At this point, we  were able to manipulate the angled Glide wire through the bend and into the  SVC.  We thus now had two wires in the SVC.   At this point we then tried to pass the right ventricular defibrillator  lead.  A 7 French sheath was placed.  However, the tip of the sheath did not  extend past the distal portion of the bend and thus we could not pass the  lead.  We then took a 9 [place] tear-away Cook sheath and through it we  passed a Medtronic [id number] 58-cm dual-coil active fixation defibrillator lead,  serial number [id number].  Under fluoroscopic guidance it was manipulated  to the right ventricular  apex and location from the pacing lead where the  bipolar R wave was 10 mV with pacing impedance of 993, threshold at 0.9 V at  0.5 msec.  Threshold 1.2 mA.  We did not measure the intrinsic R wave which  could have been done subsequently with atrial pacing.  This lead was secured  to the prepectoral fascia.  At this point, the angled Glide wire was changed  out for a Wholey wire through a 7 [place] dilator.  We then used a 10/7 van  Andel vessel dilator to open up the area at the junction of the innominate  and the SVC.  This having been done, another 9 [place] Cook tear away  introducer sheath was placed through which was then passed a Medtronic MB2  coronary sinus cannulation catheter in conjunction with the previously  placed Wholey wire.  Coronary sinus cannulation then was accomplished with  only modest difficulty.  The wire actually went into a lateral branch on its  own.  We then buddy wired a Whisper wire right into this vein and over this  passed a Medtronic 4194 88-cm passive fixation LV lead; serial number  [id number].  In this location, the bipolar L wave was from the previously  paced unit was about 20 mV of threshold, for pacing it was 0.9 V at 0.5  msec, impedance was 757 ohms, current of threshold was 1.6 mA, and there was  no diaphragmatic stimulation at 10 V.  At this point, the previously device,  the pocket was opened and the device was explanted.  The previously  implanted RV lead was a 4524; serial number [id number].  The P wave was 2.6  mV with a pacing impedance of 464 ohms, threshold 0.6 V with current of  threshold 1.6 mA.  With these stable parameters recorded, the pocket was  revised to allow for housing of the defibrillator.  The atrial lead was  freed up a little bit.  The ventricular lead was capped.  It was not  interrogated.  The leads were then attached to a specially developed Medtronic InSync Century [id number] Gold defibrillator; serial number [id number].   Through the device, there was no intrinsic rhythm.  The bipolar atrial  impedance was 400 with a threshold of 1 V at 0.2.  That was the thresholds  actually all across the chamber.  The  RV impedance was 672, and the LV  impedance was 400 ohms.  High proximal coil impedance was 54, and the distal  coil impedance was 44.   At this point, defibrillation threshold testing was undertaken.  Ventricular  fibrillation was induced via T wave shock.  After total duration of 8  seconds, a 20.3 joule shock was delivered through a measured resistance of  42 ohms; terminated ventricular fibrillation restoring an AV paced rhythm.    After wait of five minutes, ventricular fibrillation was reinduced via T  wave shock.  Again, through a measured resistance of 42 ohms a 20.3 joule  shock was delivered; terminated ventricular fibrillation restoring an AV  paced rhythm.  At this point, the device was implanted.  The pocket was  copiously irrigated with antibiotic containing saline solution.  Hemostasis  was assured.  Surgicel was placed to the inferior, the superior lateral, and  the superior medial aspects of the pocket.  A further anchoring suture was  placed because there was some oozing around the venous access sites.  The  wound was then closed in three layers in a normal fashion.  The wound was  washed and dried and benzo Steri-Strip dressing was applied.  Needle counts,  sponge counts, and instrument counts were correct at the end of the  procedure according to the staff.  The patient tolerated the procedure  without apparent complication.       SCK/MEDQ  D:  12/06/2004  T:  12/06/2004  Job:  [id number]   cc:   Dr. Worthy Keeler at [facility]   Dr. Arnoldo Hooker, [facility]

## 2010-11-20 ENCOUNTER — Telehealth: Payer: Self-pay | Admitting: *Deleted

## 2010-11-20 NOTE — Telephone Encounter (Signed)
Pt called c/o LE edema Right >Left significantly. Pt has been on Lasix 80mg  qod alternating with 40mg . Pt has incr to 80mg  daily since yesterday, she does say urine output incr after this in hopes this is pulling off fluid successfully. Pt also states that she fell recently and is worried this has affected stent in RLE. Pt is seeing vascular surgeon today Dr. Cottie Banda. Notified pt I would discuss with Dr. Graciela Husbands about incr lasix. Pt will call me back today regarding visit with Dr. Cottie Banda. Please advise.

## 2010-11-20 NOTE — Telephone Encounter (Signed)
Patient calling with results from groin/LE U/S that was done by Dr. Earnestine Leys; does not show DVT.  The patient is retaining fluid all over and was instructed by Dr. Earnestine Leys to increase Lasix to 80 mg one tablet daily until swelling has improved.  She does not have any upcoming lab appointments. She also had a chest x-ray which was normal per patient.

## 2010-11-21 ENCOUNTER — Telehealth: Payer: Self-pay

## 2010-11-21 NOTE — Telephone Encounter (Signed)
Call in week if no improvement in edema

## 2010-11-21 NOTE — Telephone Encounter (Signed)
Notified patient need to call our office if edema is not any better in one week.

## 2010-11-21 NOTE — Telephone Encounter (Signed)
Notified patient if edema not any better after one week, the patient instructed to call our office per Dr. Graciela Husbands.

## 2010-11-25 ENCOUNTER — Ambulatory Visit: Payer: Self-pay | Admitting: Vascular Surgery

## 2011-01-23 ENCOUNTER — Encounter: Payer: Medicare Other | Admitting: *Deleted

## 2011-01-28 ENCOUNTER — Encounter: Payer: Self-pay | Admitting: *Deleted

## 2011-02-11 ENCOUNTER — Other Ambulatory Visit: Payer: Self-pay

## 2011-02-11 ENCOUNTER — Telehealth: Payer: Self-pay

## 2011-02-11 DIAGNOSIS — Z79899 Other long term (current) drug therapy: Secondary | ICD-10-CM

## 2011-02-11 DIAGNOSIS — I5022 Chronic systolic (congestive) heart failure: Secondary | ICD-10-CM

## 2011-02-11 NOTE — Telephone Encounter (Signed)
Dr. Graciela Husbands had put her on a fluid pill furosemide 40 mg daily with an increase to 80 mg daily if she notice an increase in swelling.  The patient has been taking the furosemide 80 mg one tablet daily for two weeks.  She was told to get her weight in the am/pm.    Told patient need to come for a nurse visit and BMET.

## 2011-02-12 ENCOUNTER — Ambulatory Visit (INDEPENDENT_AMBULATORY_CARE_PROVIDER_SITE_OTHER): Payer: Medicare Other | Admitting: *Deleted

## 2011-02-12 DIAGNOSIS — Z79899 Other long term (current) drug therapy: Secondary | ICD-10-CM

## 2011-02-12 DIAGNOSIS — I509 Heart failure, unspecified: Secondary | ICD-10-CM

## 2011-02-12 DIAGNOSIS — I5022 Chronic systolic (congestive) heart failure: Secondary | ICD-10-CM

## 2011-02-13 LAB — BASIC METABOLIC PANEL
BUN: 35 mg/dL — ABNORMAL HIGH (ref 6–23)
Calcium: 9.9 mg/dL (ref 8.4–10.5)
Creat: 0.95 mg/dL (ref 0.50–1.10)
Glucose, Bld: 120 mg/dL — ABNORMAL HIGH (ref 70–99)
Sodium: 136 mEq/L (ref 135–145)

## 2011-07-17 ENCOUNTER — Ambulatory Visit (INDEPENDENT_AMBULATORY_CARE_PROVIDER_SITE_OTHER): Payer: Medicare Other | Admitting: *Deleted

## 2011-07-17 ENCOUNTER — Encounter: Payer: Self-pay | Admitting: Internal Medicine

## 2011-07-17 DIAGNOSIS — I471 Supraventricular tachycardia: Secondary | ICD-10-CM

## 2011-07-17 DIAGNOSIS — I5022 Chronic systolic (congestive) heart failure: Secondary | ICD-10-CM

## 2011-07-17 DIAGNOSIS — I2589 Other forms of chronic ischemic heart disease: Secondary | ICD-10-CM

## 2011-07-17 DIAGNOSIS — I442 Atrioventricular block, complete: Secondary | ICD-10-CM

## 2011-09-06 IMAGING — CR DG CHEST 2V
1 series · 2 of 2 positions shown · non-contrast
Comparison: none

REASON FOR EXAM: Cough
COMMENTS:

[Series 1: view not recorded · 0.17mm/px · 2 of 2 slices shown]
[im 1/2]
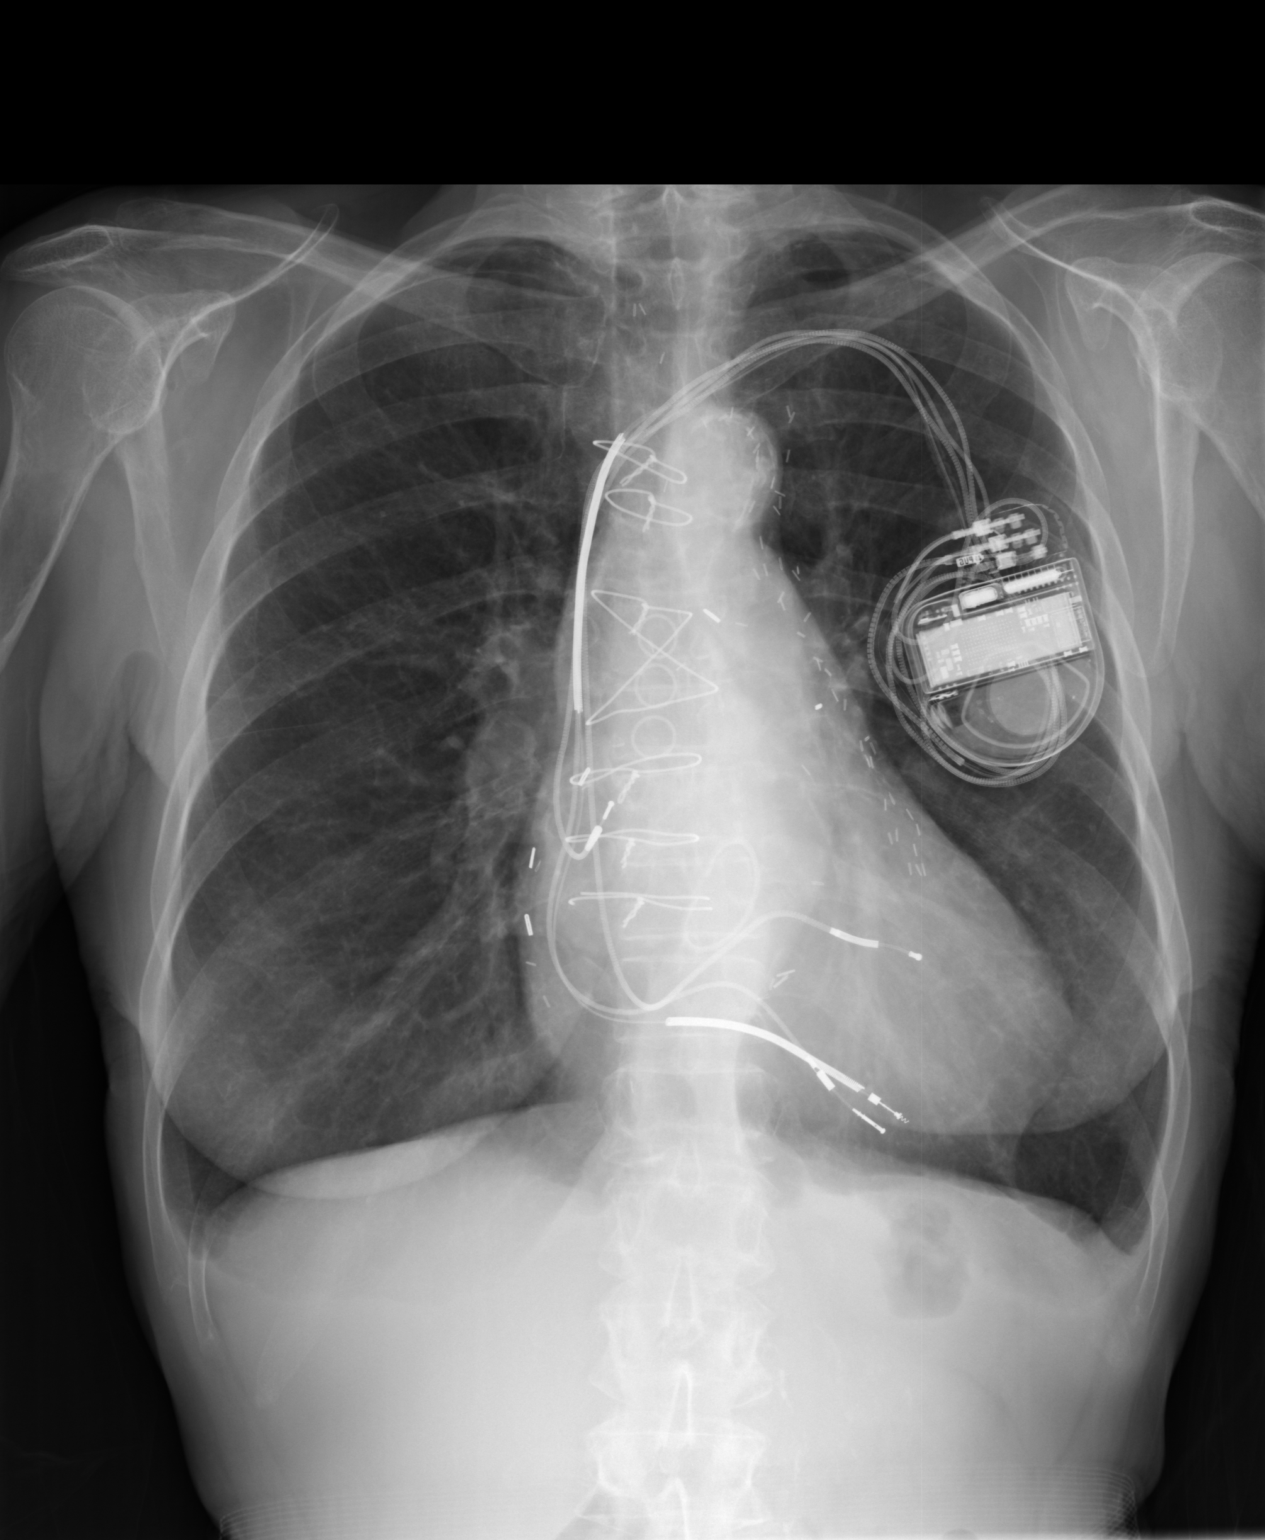
[im 2/2]
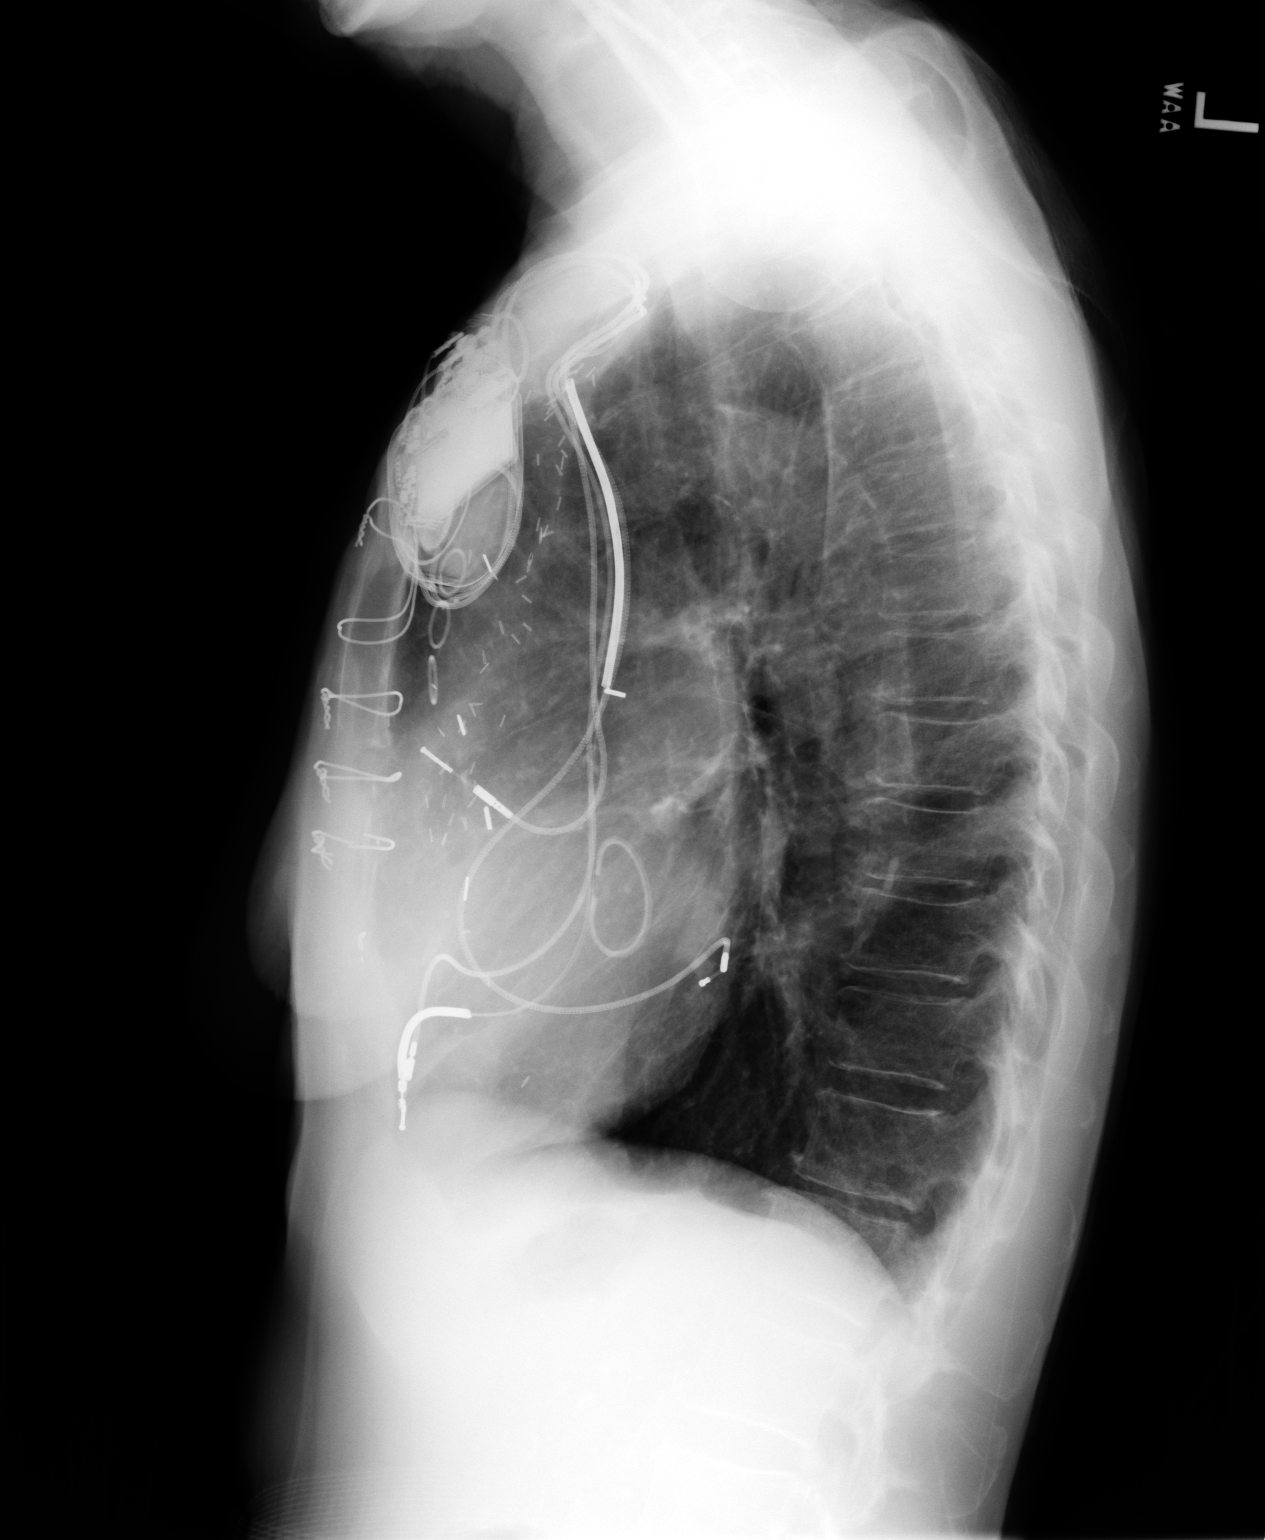

[2 of 2 positions shown; findings below may reference images not displayed]

PROCEDURE:     DXR - DXR CHEST PA (OR AP) AND LATERAL  - November 25, 2010  [DATE]

RESULT:      There is flattening of the hemidiaphragms. There is mild
hyperinflation. No focal regions of consolidation or focal infiltrates
appreciated.  The cardiac silhouette is within the upper limits of normal to
mild to moderately enlarged. The aorta is tortuous. A left-sided PICC
pectoralis pacing unit is identified with lead tips projecting in the region
of the right atrium and right ventricle. The patient is status post median
sternotomy and coronary artery bypass grafting. The visualized bony skeleton
is unremarkable.
IMPRESSION: COPD without evidence of acute cardiopulmonary disease.

## 2011-10-15 ENCOUNTER — Encounter: Payer: Self-pay | Admitting: *Deleted

## 2011-10-16 ENCOUNTER — Encounter: Payer: Self-pay | Admitting: Internal Medicine

## 2011-10-16 ENCOUNTER — Ambulatory Visit (INDEPENDENT_AMBULATORY_CARE_PROVIDER_SITE_OTHER): Payer: Medicare Other | Admitting: Internal Medicine

## 2011-10-16 VITALS — BP 106/56 | HR 60 | Ht 61.0 in | Wt 103.5 lb

## 2011-10-16 DIAGNOSIS — Z95810 Presence of automatic (implantable) cardiac defibrillator: Secondary | ICD-10-CM

## 2011-10-16 DIAGNOSIS — I5023 Acute on chronic systolic (congestive) heart failure: Secondary | ICD-10-CM

## 2011-10-16 DIAGNOSIS — I4891 Unspecified atrial fibrillation: Secondary | ICD-10-CM

## 2011-10-16 LAB — ICD DEVICE OBSERVATION
AL IMPEDENCE ICD: 380 Ohm
BAMS-0001: 200 {beats}/min
FVT: 0
LV LEAD THRESHOLD: 0.625 V
RV LEAD AMPLITUDE: 21.5 mv
TOT-0001: 1
TOT-0002: 0
TOT-0006: 20110420000000
TZAT-0001ATACH: 1
TZAT-0001ATACH: 2
TZAT-0001FASTVT: 1
TZAT-0001SLOWVT: 2
TZAT-0002ATACH: NEGATIVE
TZAT-0004ATACH: 15
TZAT-0004FASTVT: 8
TZAT-0005FASTVT: 88 pct
TZAT-0005SLOWVT: 88 pct
TZAT-0011ATACH: 10 ms
TZAT-0011SLOWVT: 10 ms
TZAT-0011SLOWVT: 10 ms
TZAT-0012ATACH: 150 ms
TZAT-0012ATACH: 150 ms
TZAT-0012FASTVT: 200 ms
TZAT-0012SLOWVT: 200 ms
TZAT-0012SLOWVT: 200 ms
TZAT-0013FASTVT: 1
TZAT-0018ATACH: NEGATIVE
TZAT-0018ATACH: NEGATIVE
TZAT-0018FASTVT: NEGATIVE
TZAT-0018SLOWVT: NEGATIVE
TZAT-0019ATACH: 6 V
TZAT-0019ATACH: 6 V
TZAT-0020ATACH: 1.5 ms
TZAT-0020ATACH: 1.5 ms
TZAT-0020SLOWVT: 1.5 ms
TZON-0003FASTVT: 290 ms
TZON-0004SLOWVT: 48
TZON-0010FASTVT: 30 ms
TZON-0010SLOWVT: 30 ms
TZST-0001ATACH: 6
TZST-0001FASTVT: 3
TZST-0001FASTVT: 4
TZST-0001FASTVT: 5
TZST-0001SLOWVT: 3
TZST-0001SLOWVT: 4
TZST-0001SLOWVT: 5
TZST-0001SLOWVT: 6
TZST-0002ATACH: NEGATIVE
TZST-0002ATACH: NEGATIVE
TZST-0002SLOWVT: NEGATIVE
TZST-0002SLOWVT: NEGATIVE
TZST-0003FASTVT: 35 J
TZST-0003FASTVT: 35 J
VENTRICULAR PACING ICD: 99.77 pct

## 2011-10-16 MED ORDER — TORSEMIDE 20 MG PO TABS: ORAL_TABLET | ORAL | Status: DC

## 2011-10-16 NOTE — Patient Instructions (Signed)
CARELINK TRANSMISSION Oct 30, 2011 AND THEN AGAIN January 23, 2012  INCREASE TORSEMIDE 40 MG IN THE AM AND 20 MG IN THE PM ALTERNATE THIS EVERY OTHER DAY WITH 20 MG TWICE DAILY DO THIS FOR 2 WEEKS THEN RESUME PREVIOUS DOSE.  Your physician wants you to follow-up in: 6 MONTHS WITH DR. Graciela Husbands. You will receive a reminder letter in the mail two months in advance. If you don't receive a letter, please call our office to schedule the follow-up appointment.

## 2011-10-16 NOTE — Assessment & Plan Note (Signed)
The patient's device was interrogated.  The information was reviewed. No changes were made in the programming.    

## 2011-10-16 NOTE — Assessment & Plan Note (Signed)
With her fluid overload and are optivol being up, we will increase her Demadex from 20/20-40/20 alternating with 20/20 for 2 weeks and recheck her optivoll and her symptoms

## 2011-10-16 NOTE — Progress Notes (Signed)
  HPI  [name] is a 76 y.o. female  seen in followup for congestive heart failure in the setting of ischemic cardiomyopathy. She is status post CRT-D implantation and notably has a gold device secondary to an allergy to the metal.    She's had recently some cough but also some shortness of breath.   She had had an abandoned but capped ventricular rate sense lead; she also had a 6949-lead. the time of her procedure we ended up pirating the previously implanted rate sense lead and capping the rate sense portion of the 6949 lead  but this ended up coming free from behind the device and began to burn to the skin into the pocket was revised in May  She has undergone  pre-generator replacement Myoview scanning demonstrating an ejection fraction of 36% scarring in the anteroapical anteroseptal region without evidence of significant ischemia. The LV is dilated.     Past Medical History  Diagnosis Date  . Atrioventricular block, complete   . Chronic systolic heart failure   . Other specified forms of chronic ischemic heart disease   . Atrial fibrillation   . CHF (congestive heart failure)   . Mechanical complication due to automatic implantable cardiac defibrillator     Medtronic, NO remote  . Paroxysmal supraventricular tachycardia     Past Surgical History  Procedure Date  . Insert / replace / remove pacemaker     Implantable Defibrillator MDT CRT    Current Outpatient Prescriptions  Medication Sig Dispense Refill  . losartan (COZAAR) 100 MG tablet Take 100 mg by mouth daily.        . metoprolol (TOPROL-XL) 50 MG 24 hr tablet Take 50 mg by mouth 2 (two) times daily.       . Multiple Vitamin (MULTIVITAMIN) tablet Take 1 tablet by mouth daily.        . simvastatin (ZOCOR) 40 MG tablet Take 40 mg by mouth at bedtime.        Marland Kitchen spironolactone (ALDACTONE) 25 MG tablet Take 25 mg by mouth daily.      Marland Kitchen torsemide (DEMADEX) 20 MG tablet Take 20 mg by mouth 2 (two) times daily.      Marland Kitchen  warfarin (COUMADIN) 6 MG tablet Take 6 mg by mouth daily.        . furosemide (LASIX) 40 MG tablet Take 40 mg by mouth as directed. 80mg  every other day and 40mg  alternating.         Allergies  Allergen Reactions  . Ketorolac Tromethamine   . Penicillins   . Sulfonamide Derivatives   . Sulfur   . Tetanus Toxoid   . Triple Antibiotic     Review of Systems negative except from HPI and PMH  Physical Exam Well developed and well nourished in no acute distress HENT normal E scleral and icterus clear Neck Supple JVP 8-9 cm; carotids brisk and full Clear to ausculation Regular rate and rhythm, no murmurs gallops or rub Soft with active bowel sounds No clubbing cyanosis; mild edema over the dorsum and the distal legs bilaterally approximately 1+ Alert and oriented, grossly normal motor and sensory function Skin Warm and Dry     Assessment and  Plan

## 2011-10-16 NOTE — Assessment & Plan Note (Signed)
intermittent

## 2011-10-30 ENCOUNTER — Encounter: Payer: Medicare Other | Admitting: *Deleted

## 2011-11-04 ENCOUNTER — Encounter: Payer: Self-pay | Admitting: *Deleted

## 2012-01-22 ENCOUNTER — Encounter: Payer: Medicare Other | Admitting: *Deleted

## 2012-01-27 ENCOUNTER — Encounter: Payer: Self-pay | Admitting: *Deleted

## 2012-03-29 ENCOUNTER — Telehealth: Payer: Self-pay | Admitting: Internal Medicine

## 2012-03-29 NOTE — Telephone Encounter (Signed)
03-29-12 LMM @ 433PM FOR PT TO SET UP OCT DEFIB WITH KLEIN/MT

## 2012-06-09 ENCOUNTER — Inpatient Hospital Stay: Payer: Self-pay | Admitting: Internal Medicine

## 2012-06-09 LAB — CBC WITH DIFFERENTIAL/PLATELET
Basophil #: 0 10*3/uL (ref 0.0–0.1)
Basophil %: 0.4 %
Eosinophil #: 0 10*3/uL (ref 0.0–0.7)
Eosinophil %: 0.3 %
Eosinophil %: 0.1 %
HCT: 20.5 % — ABNORMAL LOW (ref 35.0–47.0)
HGB: 10 g/dL — ABNORMAL LOW (ref 12.0–16.0)
Lymphocyte #: 0.7 10*3/uL — ABNORMAL LOW (ref 1.0–3.6)
Lymphocyte #: 0.7 10*3/uL — ABNORMAL LOW (ref 1.0–3.6)
Lymphocyte %: 6 %
MCH: 31.2 pg (ref 26.0–34.0)
MCHC: 35.3 g/dL (ref 32.0–36.0)
MCV: 95 fL (ref 80–100)
MCV: 89 fL (ref 80–100)
Monocyte #: 1 x10 3/mm — ABNORMAL HIGH (ref 0.2–0.9)
Monocyte %: 12.1 %
Neutrophil #: 9.4 10*3/uL — ABNORMAL HIGH (ref 1.4–6.5)
Neutrophil #: 8.5 10*3/uL — ABNORMAL HIGH (ref 1.4–6.5)
Neutrophil %: 82.9 %
Platelet: 305 10*3/uL (ref 150–440)
Platelet: 238 10*3/uL (ref 150–440)
RBC: 2.15 10*6/uL — ABNORMAL LOW (ref 3.80–5.20)
RBC: 3.2 10*6/uL — ABNORMAL LOW (ref 3.80–5.20)
RDW: 21.9 % — ABNORMAL HIGH (ref 11.5–14.5)
WBC: 11.6 10*3/uL — ABNORMAL HIGH (ref 3.6–11.0)

## 2012-06-09 LAB — COMPREHENSIVE METABOLIC PANEL
Anion Gap: 10 (ref 7–16)
Calcium, Total: 7.8 mg/dL — ABNORMAL LOW (ref 8.5–10.1)
Chloride: 95 mmol/L — ABNORMAL LOW (ref 98–107)
Creatinine: 1.77 mg/dL — ABNORMAL HIGH (ref 0.60–1.30)
EGFR (African American): 32 — ABNORMAL LOW
SGOT(AST): 51 U/L — ABNORMAL HIGH (ref 15–37)

## 2012-06-09 LAB — PROTIME-INR
INR: 1.8
Prothrombin Time: 37.4 secs — ABNORMAL HIGH (ref 11.5–14.7)
Prothrombin Time: 21.5 secs — ABNORMAL HIGH (ref 11.5–14.7)

## 2012-06-10 LAB — BASIC METABOLIC PANEL
Anion Gap: 8 (ref 7–16)
BUN: 80 mg/dL — ABNORMAL HIGH (ref 7–18)
Calcium, Total: 7.8 mg/dL — ABNORMAL LOW (ref 8.5–10.1)
Chloride: 96 mmol/L — ABNORMAL LOW (ref 98–107)
Co2: 29 mmol/L (ref 21–32)
Osmolality: 290 (ref 275–301)
Potassium: 3.2 mmol/L — ABNORMAL LOW (ref 3.5–5.1)

## 2012-06-11 LAB — CBC WITH DIFFERENTIAL/PLATELET
Basophil #: 0.2 10*3/uL — ABNORMAL HIGH (ref 0.0–0.1)
HCT: 30.8 % — ABNORMAL LOW (ref 35.0–47.0)
Lymphocyte #: 0.4 10*3/uL — ABNORMAL LOW (ref 1.0–3.6)
Lymphocyte %: 3.9 %
MCH: 30.4 pg (ref 26.0–34.0)
MCHC: 33.6 g/dL (ref 32.0–36.0)
MCV: 91 fL (ref 80–100)
Monocyte #: 0.9 x10 3/mm (ref 0.2–0.9)
Neutrophil #: 7.7 10*3/uL — ABNORMAL HIGH (ref 1.4–6.5)
Platelet: 226 10*3/uL (ref 150–440)
RDW: 22.4 % — ABNORMAL HIGH (ref 11.5–14.5)

## 2012-06-11 LAB — BASIC METABOLIC PANEL
Anion Gap: 9 (ref 7–16)
BUN: 65 mg/dL — ABNORMAL HIGH (ref 7–18)
Chloride: 98 mmol/L (ref 98–107)
Co2: 27 mmol/L (ref 21–32)
Creatinine: 1.28 mg/dL (ref 0.60–1.30)
EGFR (Non-African Amer.): 41 — ABNORMAL LOW
Sodium: 134 mmol/L — ABNORMAL LOW (ref 136–145)

## 2012-06-11 LAB — PROTIME-INR
INR: 1.8
Prothrombin Time: 20.8 secs — ABNORMAL HIGH (ref 11.5–14.7)

## 2012-06-11 LAB — PROTEIN / CREATININE RATIO, URINE: Protein/Creat. Ratio: 275 mg/gCREAT — ABNORMAL HIGH (ref 0–200)

## 2012-06-12 LAB — BASIC METABOLIC PANEL
BUN: 58 mg/dL — ABNORMAL HIGH (ref 7–18)
Glucose: 95 mg/dL (ref 65–99)
Osmolality: 279 (ref 275–301)
Potassium: 4 mmol/L (ref 3.5–5.1)
Sodium: 131 mmol/L — ABNORMAL LOW (ref 136–145)

## 2012-06-14 LAB — UR PROT ELECTROPHORESIS, URINE RANDOM

## 2012-06-24 ENCOUNTER — Encounter: Payer: Self-pay | Admitting: *Deleted

## 2012-08-10 ENCOUNTER — Encounter: Payer: Medicare Other | Admitting: Internal Medicine

## 2012-08-17 ENCOUNTER — Encounter: Payer: Self-pay | Admitting: Internal Medicine

## 2012-08-17 ENCOUNTER — Ambulatory Visit (INDEPENDENT_AMBULATORY_CARE_PROVIDER_SITE_OTHER): Payer: Medicare Other | Admitting: Internal Medicine

## 2012-08-17 VITALS — BP 122/70 | HR 90 | Ht 62.0 in | Wt 99.5 lb

## 2012-08-17 DIAGNOSIS — I442 Atrioventricular block, complete: Secondary | ICD-10-CM

## 2012-08-17 DIAGNOSIS — I2589 Other forms of chronic ischemic heart disease: Secondary | ICD-10-CM

## 2012-08-17 DIAGNOSIS — Z95810 Presence of automatic (implantable) cardiac defibrillator: Secondary | ICD-10-CM

## 2012-08-17 DIAGNOSIS — I4891 Unspecified atrial fibrillation: Secondary | ICD-10-CM

## 2012-08-17 DIAGNOSIS — Z9981 Dependence on supplemental oxygen: Secondary | ICD-10-CM

## 2012-08-17 DIAGNOSIS — I471 Supraventricular tachycardia, unspecified: Secondary | ICD-10-CM

## 2012-08-17 DIAGNOSIS — I5022 Chronic systolic (congestive) heart failure: Secondary | ICD-10-CM

## 2012-08-17 LAB — ICD DEVICE OBSERVATION
AL AMPLITUDE: 1.9 mv
AL THRESHOLD: 0.75 V
ATRIAL PACING ICD: 29.47 pct
BATTERY VOLTAGE: 3.017 V
FVT: 0
LV LEAD IMPEDENCE ICD: 437 Ohm
LV LEAD THRESHOLD: 0.75 V
RV LEAD AMPLITUDE: 20 mv
RV LEAD IMPEDENCE ICD: 475 Ohm
RV LEAD THRESHOLD: 0.75 V
TOT-0006: 20110420000000
TZAT-0001ATACH: 1
TZAT-0001ATACH: 2
TZAT-0001ATACH: 3
TZAT-0001FASTVT: 1
TZAT-0001SLOWVT: 1
TZAT-0001SLOWVT: 2
TZAT-0004SLOWVT: 8
TZAT-0004SLOWVT: 8
TZAT-0005FASTVT: 88 pct
TZAT-0011ATACH: 10 ms
TZAT-0011FASTVT: 10 ms
TZAT-0012ATACH: 150 ms
TZAT-0012ATACH: 150 ms
TZAT-0013SLOWVT: 2
TZAT-0018FASTVT: NEGATIVE
TZAT-0020ATACH: 1.5 ms
TZAT-0020ATACH: 1.5 ms
TZAT-0020ATACH: 1.5 ms
TZAT-0020SLOWVT: 1.5 ms
TZAT-0020SLOWVT: 1.5 ms
TZON-0003FASTVT: 290 ms
TZON-0004VSLOWVT: 52
TZON-0010SLOWVT: 30 ms
TZST-0001ATACH: 6
TZST-0001FASTVT: 2
TZST-0001FASTVT: 6
TZST-0001SLOWVT: 3
TZST-0001SLOWVT: 4
TZST-0002ATACH: NEGATIVE
TZST-0002ATACH: NEGATIVE
TZST-0002SLOWVT: NEGATIVE
TZST-0002SLOWVT: NEGATIVE
TZST-0002SLOWVT: NEGATIVE
TZST-0003FASTVT: 35 J
TZST-0003FASTVT: 35 J
TZST-0003FASTVT: 35 J
VF: 0

## 2012-08-17 NOTE — Assessment & Plan Note (Signed)
Not clear the issue  Will get records from [place] and schedule her to see Pulmonary to both review for patient as to what happened, and assist with management

## 2012-08-17 NOTE — Assessment & Plan Note (Signed)
Stable  Much of her afterload reduction has been stopped.  Will ask Dr BK about resuming it.

## 2012-08-17 NOTE — Assessment & Plan Note (Signed)
Infrequent AF loses BiV pacing at that time.  Not sufficient though to pursue av ablation

## 2012-08-17 NOTE — Assessment & Plan Note (Signed)
euvolemic Optivol now back at baseline _hemodynamic monitoring

## 2012-08-17 NOTE — Progress Notes (Signed)
  HPI  [name] is a 77 y.o. female  seen in followup for congestive heart failure in the setting of ischemic cardiomyopathy. She is status post CRT-D implantation and notably has a gold device secondary to an allergy to the metal.        She has undergone  pre-generator replacement Myoview scanning demonstrating an ejection fraction of 36% scarring in the anteroapical anteroseptal region without evidence of significant ischemia. The LV is dilated.     Past Medical History  Diagnosis Date  . Atrioventricular block, complete   . Chronic systolic heart failure   . Other specified forms of chronic ischemic heart disease   . Atrial fibrillation   . CHF (congestive heart failure)   . Mechanical complication due to automatic implantable cardiac defibrillator     Medtronic, NO remote  . Paroxysmal supraventricular tachycardia   . Hematoma     left thigh    Past Surgical History  Procedure Laterality Date  . Insert / replace / remove pacemaker      Implantable Defibrillator MDT CRT    Current Outpatient Prescriptions  Medication Sig Dispense Refill  . carvedilol (COREG) 3.125 MG tablet Take 3.125 mg by mouth 2 (two) times daily with a meal.      . furosemide (LASIX) 40 MG tablet Take 40 mg by mouth as directed. 80mg  every other day and 40mg  alternating.       . Multiple Vitamin (MULTIVITAMIN) tablet Take 1 tablet by mouth daily.        . potassium chloride SA (K-DUR,KLOR-CON) 20 MEQ tablet Take 1 tablet (20 mEq total) by mouth daily.  30 tablet  6  . simvastatin (ZOCOR) 40 MG tablet Take 40 mg by mouth at bedtime.        . torsemide (DEMADEX) 20 MG tablet TAKE 40 MG IN THE AM AND TAKE 20 MG IN THE PM WITH ALTERNATING 20 MG TWICE DAILY EVERY OTHER DAY DO THIS FOR 2 WEEKS THEN RESUME BACK TO PREVIOUS DOSE  90 tablet  3   No current facility-administered medications for this visit.    Allergies  Allergen Reactions  . Ketorolac Tromethamine   . Morphine And Related     vomiting   . Neomycin-Bacitracin Zn-Polymyx   . Penicillins   . Sulfonamide Derivatives   . Sulfur   . Tetanus Toxoid     Review of Systems negative except from HPI and PMH  Physical Exam Well developed and well nourished in no acute distress HENT normal E scleral and icterus clear Neck Supple JVP 8-9 cm; carotids brisk and full Clear to ausculation Regular rate and rhythm, no murmurs gallops or rub Soft with active bowel sounds No clubbing cyanosis; mild edema over the dorsum and the distal legs bilaterally approximately 1+ Alert and oriented, grossly normal motor and sensory function Skin Warm and Dry     Assessment and  Plan

## 2012-08-17 NOTE — Assessment & Plan Note (Signed)
The patient's device was interrogated.  The information was reviewed. No changes were made in the programming.    

## 2012-08-17 NOTE — Patient Instructions (Addendum)
Your physician wants you to follow-up in: 1 year with Dr. Graciela Husbands.  Carelink transmission Nov 15, 2012. You will receive a reminder letter in the mail two months in advance. If you don't receive a letter, please call our office to schedule the follow-up appointment.  Referral to Dr. Kendrick Fries in [place].  We will call you with appointment.

## 2012-08-19 ENCOUNTER — Encounter: Payer: Medicare Other | Admitting: Internal Medicine

## 2012-08-23 ENCOUNTER — Other Ambulatory Visit: Payer: Self-pay

## 2012-08-23 ENCOUNTER — Telehealth: Payer: Self-pay

## 2012-08-23 DIAGNOSIS — J449 Chronic obstructive pulmonary disease, unspecified: Secondary | ICD-10-CM

## 2012-08-23 NOTE — Telephone Encounter (Signed)
pts grandson made aware of appt with Dr. Kendrick Fries 3/18 at 2:30

## 2012-09-14 ENCOUNTER — Encounter: Payer: Self-pay | Admitting: Pulmonary Disease

## 2012-09-14 ENCOUNTER — Ambulatory Visit (INDEPENDENT_AMBULATORY_CARE_PROVIDER_SITE_OTHER): Payer: Medicare Other | Admitting: Pulmonary Disease

## 2012-09-14 VITALS — BP 120/64 | HR 70 | Temp 97.6°F | Ht 61.5 in | Wt 102.0 lb

## 2012-09-14 DIAGNOSIS — R0600 Dyspnea, unspecified: Secondary | ICD-10-CM

## 2012-09-14 DIAGNOSIS — I2589 Other forms of chronic ischemic heart disease: Secondary | ICD-10-CM

## 2012-09-14 DIAGNOSIS — J441 Chronic obstructive pulmonary disease with (acute) exacerbation: Secondary | ICD-10-CM

## 2012-09-14 DIAGNOSIS — J309 Allergic rhinitis, unspecified: Secondary | ICD-10-CM

## 2012-09-14 DIAGNOSIS — R0609 Other forms of dyspnea: Secondary | ICD-10-CM

## 2012-09-14 DIAGNOSIS — R0989 Other specified symptoms and signs involving the circulatory and respiratory systems: Secondary | ICD-10-CM

## 2012-09-14 DIAGNOSIS — J9611 Chronic respiratory failure with hypoxia: Secondary | ICD-10-CM | POA: Insufficient documentation

## 2012-09-14 MED ORDER — TIOTROPIUM BROMIDE MONOHYDRATE 18 MCG IN CAPS: 18.0000 ug | ORAL_CAPSULE | Freq: Every day | RESPIRATORY_TRACT | Status: DC

## 2012-09-14 MED ORDER — MOMETASONE FUROATE 50 MCG/ACT NA SUSP: 2.0000 | Freq: Every day | NASAL | Status: AC

## 2012-09-14 NOTE — Progress Notes (Signed)
Subjective:    Patient ID: [name], female    DOB: 06-Mar-1936, 77 y.o.   MRN: [id number]  HPI  This is a very pleasant 77 year old female who comes to our clinic today to establish care for COPD. She states that her childhood was normal until age 22 when she developed multilobar pneumonia requiring hospitalization. This occurred again 2 and half years later. Later in adulthood while pregnant she developed "walking pneumonia" for which she did not require hospitalization. She has not had pneumonia since then but she has had multiple episodes of bronchitis. This occurred specifically while she was working at [employer]. She retired 5 years ago and the episodes of bronchitis decrease in frequency. During that time she felt like she had minimal shortness of breath but she did note a chronic cough productive of clear to yellow sputum. Notably, she smoked one half to one pack of cigarettes daily for 45 years and quit in September 2013. She states that at some point after she retired from [employer] she was diagnosed with COPD. She believes that this diagnosis was made do to her chronic cough. She really did not experience any significant limitation in activity or shortness of breath until September 2013 when she had a fall while on vacation in [place]. At that point she had a hip fracture and required a lengthy hospitalization. During that hospitalization she had open reduction and internal fixation of her hip fracture but also suffered significant shortness of breath and was started on oxygen for the first time in her life. She was discharged on 3-4 L of oxygen continuously. Since that time she has been more short of breath and has had a worsening cough with sputum production. She has gradually wean down her oxygen requirement to 2 L continuously. She says that shortness of breath has been more of a problem for her since then but it is gradually improving. Sometimes she gets short of breath when she is  walking from her living room to her kitchen. Other times, while using oxygen continuously she can walk through [employer] without stopping on a grocery drip. She sometimes brings in groceries without difficulty. She usually can climb a flight of stairs without shortness of breath but this does happen rarely. She has taken albuterol in the past and says it helps. At some point she was given Advair and Spiriva but at some point (she doesn't remember when) she was told to stop taking his medications.    Had or remember when a cotcortr tell her she had copd about 5 years ago  Chronic cough lead to that   Sometimes worse in evening (cough), sometimes dueinr day after dinner; sleeps in a recliner usually not to bad in mornings  Brings up white to yellow phlegm every day  Had pneumonia 2-3 times hospitalized >5 years ago  Childhood age 28 had pneumonai first time, then again had pneumona 2.5 years later; then not again until she was pregnant had walking pneumonia; after that doesn't remember  Has had bronchitis, mostly when she was working at [employer] three years ago Usual Rx Zpack, other antibiotics, sometime prednisone  Has a rescue inhaler but she never uses it, out of date (albuterol); helped when she took it  Smoked 1/2 to 1 ppd for 45 years; quit 02/2012  Put on oxygen after a fall and hip fracture in [place] 02/2012; supposed to be on it all  Checked for a blood clot, said no; was on 3L/min for a while, she  is weaning herself off Had three Ti screws placed  Since retiring she thinks that her breathing was doing OK Since surgery things worse  More cough, more shortness of breath  SOB comes and goes, some days minimal activity from living room to kitchen; sometimes eveng with O2, but more without On her best day she can can walk through grocery store; can climb stairs sometimes dyspnic; hasn't run   Not sure if she is limited by dyspnea; sometimes with spells   When she has spell of dyspnea,  not sure if wheezing or coughing more; rare  Wants off O2  Has done physical therapy  Exercises regularly, but not cardiovascular    Past Medical History  Diagnosis Date  . Atrioventricular block, complete   . Chronic systolic heart failure   . Other specified forms of chronic ischemic heart disease   . Atrial fibrillation   . CHF (congestive heart failure)   . Mechanical complication due to automatic implantable cardiac defibrillator     Medtronic, NO remote  . Paroxysmal supraventricular tachycardia   . Hematoma     left thigh     Family History  Problem Relation Age of Onset  . Heart failure Mother   . Heart failure Father   . Heart failure Sister   . Heart failure Sister   . Nephritis Brother 5  . Heart failure Brother   . Heart failure Brother   . Stroke Brother   . Arrhythmia Sister     2 bypasses  . Heart attack Sister     multiple  . Stroke Sister     multiples  . SIDS      3 months, child  . Other  48    meningitis & staff infection  . Asthma      2 children     History   Social History  . Marital Status: Widowed    Spouse Name: N/A    Number of Children: 4  . Years of Education: N/A   Occupational History  . Not on file.   Social History Main Topics  . Smoking status: Former Smoker -- 1.00 packs/day for 45 years    Types: Cigarettes    Quit date: 02/29/2012  . Smokeless tobacco: Current User  . Alcohol Use: No  . Drug Use: No  . Sexually Active: Not on file   Other Topics Concern  . Not on file   Social History Narrative  . No narrative on file     Allergies  Allergen Reactions  . Ketorolac Tromethamine   . Morphine And Related     vomiting  . Neomycin-Bacitracin Zn-Polymyx   . Penicillins   . Sulfonamide Derivatives   . Sulfur   . Tetanus Toxoid      Outpatient Prescriptions Prior to Visit  Medication Sig Dispense Refill  . carvedilol (COREG) 3.125 MG tablet Take 3.125 mg by mouth 2 (two) times daily with a meal.       . Multiple Vitamin (MULTIVITAMIN) tablet Take 1 tablet by mouth daily.        . potassium chloride SA (K-DUR,KLOR-CON) 20 MEQ tablet Take 1 tablet (20 mEq total) by mouth daily.  30 tablet  6  . simvastatin (ZOCOR) 40 MG tablet Take 40 mg by mouth at bedtime.        . torsemide (DEMADEX) 20 MG tablet TAKE 40 MG IN THE AM AND TAKE 20 MG IN THE PM WITH ALTERNATING 20 MG TWICE DAILY EVERY OTHER  DAY DO THIS FOR 2 WEEKS THEN RESUME BACK TO PREVIOUS DOSE  90 tablet  3  . furosemide (LASIX) 40 MG tablet Take 40 mg by mouth daily.        No facility-administered medications prior to visit.      Review of Systems  Constitutional: Negative for fever, chills and unexpected weight change.  HENT: Positive for sinus pressure. Negative for ear pain, nosebleeds, congestion, sore throat, rhinorrhea, sneezing, trouble swallowing, dental problem, voice change and postnasal drip.   Eyes: Negative for visual disturbance.  Respiratory: Positive for cough and shortness of breath. Negative for choking.   Cardiovascular: Negative for chest pain and leg swelling.  Gastrointestinal: Negative for vomiting, abdominal pain and diarrhea.  Genitourinary: Negative for difficulty urinating.  Musculoskeletal: Negative for arthralgias.  Skin: Negative for rash.  Neurological: Negative for tremors, syncope and headaches.  Hematological: Bruises/bleeds easily.       Objective:   Physical Exam  Filed Vitals:   09/14/12 1442  BP: 120/64  Pulse: 70  Temp: 97.6 F (36.4 C)  TempSrc: Oral  Height: 5' 1.5" (1.562 m)  Weight: 102 lb (46.267 kg)  SpO2: 90%   Gen: thin, elderly female, no acute distress HEENT: NCAT, PERRL, EOMi, OP clear, neck supple without masses PULM: poor air movement, no wheezing CV: RRR, no murmur, + S3, no JVD AB: BS+, soft, nontender, no hsm Ext: warm, no edema, no clubbing, no cyanosis Derm: no rash or skin breakdown Neuro: A&Ox4, CN II-XII intact, strength 5/5 in all 4  extremities  10/2010 CXR [facility]> bilateral emphysema, flattened diaphragms, ICD/dual chamber/CRT pacer in place     Assessment & Plan:   COPD exacerbation COPD: GOLD Grade C Combined recommendations from the [employer], [employer] of [employer]icians, Designer, television/film set, European Respiratory Society (Qaseem A et al, Ann Intern Med. 2011;155(3):179) recommends tobacco cessation, pulmonary rehab (for symptomatic patients with an FEV1 < 50% predicted), supplemental oxygen (for patients with SaO2 <88% or paO2 <55), and appropriate bronchodilator therapy.  In regards to long acting bronchodilators, they recommend monotherapy (FEV1 60-80% with symptoms weak evidence, FEV1 with symptoms <60% strong evidence), or combination therapy (FEV1 <60% with symptoms, strong recommendation, moderate evidence).  One should also provide patients with annual immunizations and consider therapy for prevention of COPD exacerbations (ie. roflumilast or azithromycin) when appopriate.  -O2 therapy: Continue 2L continuously -Immunizations: UTD -Tobacco use: Quit 2013 -Exercise: Would like to start pulmonary rehab if OK with Dr. Graciela Husbands, will clear with him -Bronchodilator therapy: Start spiriva daily -Exacerbation prevention: Spiriva, pulm rehab   CARDIOMYOPATHY, ISCHEMIC Appears euvolemic on exam today.  Will clear pulmonary rehab with Dr. Graciela Husbands.  Allergic rhinitis This sounds like it gets worse in the springtime.   Plan: -start Nasonex, instructed on proper use   Updated Medication List Outpatient Encounter Prescriptions as of 09/14/2012  Medication Sig Dispense Refill  . carvedilol (COREG) 3.125 MG tablet Take 3.125 mg by mouth 2 (two) times daily with a meal.      . Multiple Vitamin (MULTIVITAMIN) tablet Take 1 tablet by mouth daily.        . potassium chloride SA (K-DUR,KLOR-CON) 20 MEQ tablet Take 1 tablet (20 mEq total) by mouth daily.  30 tablet  6  . simvastatin (ZOCOR)  40 MG tablet Take 40 mg by mouth at bedtime.        . torsemide (DEMADEX) 20 MG tablet 40 mg twice daily      . [DISCONTINUED] torsemide (  DEMADEX) 20 MG tablet TAKE 40 MG IN THE AM AND TAKE 20 MG IN THE PM WITH ALTERNATING 20 MG TWICE DAILY EVERY OTHER DAY DO THIS FOR 2 WEEKS THEN RESUME BACK TO PREVIOUS DOSE  90 tablet  3  . mometasone (NASONEX) 50 MCG/ACT nasal spray Place 2 sprays into the nose daily.  17 g  2  . tiotropium (SPIRIVA HANDIHALER) 18 MCG inhalation capsule Place 1 capsule (18 mcg total) into inhaler and inhale daily.  30 capsule  2  . [DISCONTINUED] furosemide (LASIX) 40 MG tablet Take 40 mg by mouth daily.        No facility-administered encounter medications on file as of 09/14/2012.

## 2012-09-14 NOTE — Assessment & Plan Note (Signed)
Appears euvolemic on exam today.  Will clear pulmonary rehab with Dr. Graciela Husbands.

## 2012-09-14 NOTE — Assessment & Plan Note (Signed)
COPD: GOLD Grade C Combined recommendations from the [employer]	[place], [employer], Designer, television/film set, European Respiratory Society (Qaseem A et al, Ann Intern Med. 2011;155(3):179) recommends tobacco cessation, pulmonary rehab (for symptomatic patients with an FEV1 < 50% predicted), supplemental oxygen (for patients with SaO2 <88% or paO2 <55), and appropriate bronchodilator therapy.  In regards to long acting bronchodilators, they recommend monotherapy (FEV1 60-80% with symptoms weak evidence, FEV1 with symptoms <60% strong evidence), or combination therapy (FEV1 <60% with symptoms, strong recommendation, moderate evidence).  One should also provide patients with annual immunizations and consider therapy for prevention of COPD exacerbations (ie. roflumilast or azithromycin) when appopriate.  -O2 therapy: Continue 2L continuously -Immunizations: UTD -Tobacco use: Quit 2013 -Exercise: Would like to start pulmonary rehab if OK with Dr. Graciela Husbands, will clear with him -Bronchodilator therapy: Start spiriva daily -Exacerbation prevention: Spiriva, pulm rehab

## 2012-09-14 NOTE — Patient Instructions (Addendum)
You have COPD due to previous cigarette use. Start using spiriva once a day no matter how you feel. Continue using oxygen as written for now If it is OK with Dr. Graciela Husbands, you should start pulmonary rehab.  We will call his office to ask about this You should get a flu shot every year. We will see you back in 4-6 weeks or sooner if needed

## 2012-09-14 NOTE — Assessment & Plan Note (Signed)
This sounds like it gets worse in the springtime.   Plan: -start Nasonex, instructed on proper use

## 2012-09-21 ENCOUNTER — Telehealth: Payer: Self-pay | Admitting: *Deleted

## 2012-09-21 DIAGNOSIS — J9610 Chronic respiratory failure, unspecified whether with hypoxia or hypercapnia: Secondary | ICD-10-CM

## 2012-09-21 NOTE — Telephone Encounter (Signed)
Message copied by Christen Butter on Tue Sep 21, 2012  9:08 AM ------      Message from: Lupita Leash      Created: Tue Sep 21, 2012  8:51 AM       L,            Please let her know that Dr. Graciela Husbands says it is OK for her to start pulmonary rehab.  We may need to send along a referral for this.            Thanks,      B      ----- Message -----         From: Duke Salvia, MD         Sent: 09/20/2012   1:46 PM           To: Lupita Leash, MD            No problem w exercise from my point of view      steve      ----- Message -----         From: Lupita Leash, MD         Sent: 09/14/2012   3:39 PM           To: Duke Salvia, MD            Cleone Slim Brett Canales,            Nice lady.  She tells me she was told that she can't exercise for a heart reason.  Is that right?  I'd love to get her into pulmonary rehab as I think it would help her quite a bit.            Thanks,      Heber Becker              ------

## 2012-09-21 NOTE — Telephone Encounter (Signed)
LMTCB for the pt I already sent referral to [facility] for rehab

## 2012-09-23 NOTE — Telephone Encounter (Signed)
Spoke with the pt and notified okay per Dr Graciela Husbands and Dr Kendrick Fries to attend pulm rehab and I have already placed order to [facility] She verbalized understanding and states no questions

## 2012-10-18 ENCOUNTER — Encounter: Payer: Self-pay | Admitting: *Deleted

## 2012-10-18 ENCOUNTER — Ambulatory Visit: Payer: Self-pay | Admitting: Internal Medicine

## 2012-10-19 ENCOUNTER — Ambulatory Visit: Payer: Medicare Other | Admitting: Pulmonary Disease

## 2012-10-26 ENCOUNTER — Ambulatory Visit (INDEPENDENT_AMBULATORY_CARE_PROVIDER_SITE_OTHER): Payer: Medicare Other | Admitting: Pulmonary Disease

## 2012-10-26 ENCOUNTER — Encounter: Payer: Self-pay | Admitting: Pulmonary Disease

## 2012-10-26 VITALS — BP 118/72 | HR 80 | Temp 98.1°F | Ht 61.0 in | Wt 97.0 lb

## 2012-10-26 DIAGNOSIS — J9611 Chronic respiratory failure with hypoxia: Secondary | ICD-10-CM

## 2012-10-26 DIAGNOSIS — J9610 Chronic respiratory failure, unspecified whether with hypoxia or hypercapnia: Secondary | ICD-10-CM

## 2012-10-26 DIAGNOSIS — J449 Chronic obstructive pulmonary disease, unspecified: Secondary | ICD-10-CM | POA: Insufficient documentation

## 2012-10-26 NOTE — Assessment & Plan Note (Signed)
She is doing much better on the Spiriva, but has yet to start pulmonary rehab.  Plan: -continue spiriva -start pulmonary rehab

## 2012-10-26 NOTE — Patient Instructions (Addendum)
Keep using your Spiriva and nasonex as you are doing Keep using your oxygen as you are doing  Call the folks at [facility] pulmonary rehab about when you can start over there.  We will see you back in 4 months or sooner if needed

## 2012-10-26 NOTE — Progress Notes (Signed)
Subjective:    Patient ID: [name], female    DOB: [birth year], 77 y.o.   MRN: [id number]  Synopsis: Ms. Boutelle first saw the [facility] clinic in 08/2012 for evaluation of COPD.  Her 09/14/2012 Simple Spirometry> Ratio 52%, FEV1 0.68L (36% pred).  She was started on Spiriva and 2L O2 continuously.  HPI  10/26/2012 ROV -- Sahiti has been doing very well since starting Spiriva and using oxygen continuously. She has been having a lot of hip pain but despite this states that her shortness of breath has improved. She has not heard from pulmonary rehabilitation yet and so therefore she has not started there. She says her sinus congestion has improved since starting the Nasonex. Otherwise things have been stable.  Past Medical History  Diagnosis Date  . Atrioventricular block, complete   . Chronic systolic heart failure   . Other specified forms of chronic ischemic heart disease   . Atrial fibrillation   . CHF (congestive heart failure)   . Mechanical complication due to automatic implantable cardiac defibrillator     Medtronic, NO remote  . Paroxysmal supraventricular tachycardia   . Hematoma     left thigh    Review of Systems  Constitutional: Negative for fever, chills and fatigue.  HENT: Negative for congestion, rhinorrhea, postnasal drip and sinus pressure.   Respiratory: Positive for shortness of breath. Negative for cough, wheezing and stridor.   Cardiovascular: Negative for chest pain, palpitations and leg swelling.  Musculoskeletal: Positive for arthralgias.       Objective:   Physical Exam  Filed Vitals:   10/26/12 1223  BP: 118/72  Pulse: 80  Temp: 98.1 F (36.7 C)  TempSrc: Oral  Height: 5\' 1"  (1.549 m)  Weight: 97 lb (43.999 kg)  SpO2: 99%    Gen: chronically ill appearing, no acute distress HEENT: NCAT, EOMi, OP clear,  PULM: Poor air movement, few insp wheezes CV: RRR, no mgr, no JVD AB: soft, nontender, no hsm Ext: warm, no edema, no  clubbing, no cyanosis  09/14/2012 Simple Spirometry> Ratio 52%, FEV1 0.68L (36% pred)     Assessment & Plan:   COPD, Severe She is doing much better on the Spiriva, but has yet to start pulmonary rehab.  Plan: -continue spiriva -start pulmonary rehab  Chronic hypoxemic respiratory failure Continue 2 L [place] continuously   Updated Medication List Outpatient Encounter Prescriptions as of 10/26/2012  Medication Sig Dispense Refill  . allopurinol (ZYLOPRIM) 300 MG tablet Take 300 mg by mouth daily.      . carvedilol (COREG) 3.125 MG tablet Take 3.125 mg by mouth 2 (two) times daily with a meal.      . losartan (COZAAR) 25 MG tablet Take 25 mg by mouth daily.      . mometasone (NASONEX) 50 MCG/ACT nasal spray Place 2 sprays into the nose daily.  17 g  2  . Multiple Vitamin (MULTIVITAMIN) tablet Take 1 tablet by mouth daily.        . potassium chloride SA (K-DUR,KLOR-CON) 20 MEQ tablet Take 1 tablet (20 mEq total) by mouth daily.  30 tablet  6  . simvastatin (ZOCOR) 40 MG tablet Take 40 mg by mouth at bedtime.        Marland Kitchen tiotropium (SPIRIVA HANDIHALER) 18 MCG inhalation capsule Place 1 capsule (18 mcg total) into inhaler and inhale daily.  30 capsule  2  . torsemide (DEMADEX) 20 MG tablet 40 mg twice daily  No facility-administered encounter medications on file as of 10/26/2012.

## 2012-10-26 NOTE — Assessment & Plan Note (Signed)
Continue 2 L [place] continuously 

## 2012-11-03 ENCOUNTER — Telehealth: Payer: Self-pay | Admitting: *Deleted

## 2012-11-03 ENCOUNTER — Telehealth: Payer: Self-pay

## 2012-11-03 NOTE — Telephone Encounter (Signed)
Pt called to say she has just been shocked by ICD She has a 6949 lead She denies any symptoms of sob, dizziness and is alert and oriented I had her hold while I contacted Baxter Hire in device clinic at [address] office She says she will call pt now at home # Pt was informed and will await kristen's call

## 2012-11-04 NOTE — Telephone Encounter (Signed)
Pt's device was checked in [place] office. Dr Mariah Milling increased Coreg until GT reviews strips on 11-04-12.

## 2012-11-05 NOTE — Telephone Encounter (Signed)
Dr Ladona Ridgel reviewed strips and instructed for pt to stay on 2 tablets bid of Carvedilol. Follow up with Dr Graciela Husbands on 11-30-12 @ 1500.

## 2012-11-15 ENCOUNTER — Encounter: Payer: Medicare Other | Admitting: *Deleted

## 2012-11-19 ENCOUNTER — Encounter: Payer: Self-pay | Admitting: *Deleted

## 2012-11-30 ENCOUNTER — Encounter: Payer: Self-pay | Admitting: Internal Medicine

## 2012-11-30 ENCOUNTER — Ambulatory Visit (INDEPENDENT_AMBULATORY_CARE_PROVIDER_SITE_OTHER): Payer: Medicare Other | Admitting: Internal Medicine

## 2012-11-30 VITALS — BP 118/66 | HR 95 | Ht 61.5 in | Wt 97.0 lb

## 2012-11-30 DIAGNOSIS — Z95810 Presence of automatic (implantable) cardiac defibrillator: Secondary | ICD-10-CM

## 2012-11-30 DIAGNOSIS — I4891 Unspecified atrial fibrillation: Secondary | ICD-10-CM

## 2012-11-30 DIAGNOSIS — I5022 Chronic systolic (congestive) heart failure: Secondary | ICD-10-CM

## 2012-11-30 DIAGNOSIS — I471 Supraventricular tachycardia: Secondary | ICD-10-CM

## 2012-11-30 DIAGNOSIS — I2589 Other forms of chronic ischemic heart disease: Secondary | ICD-10-CM

## 2012-11-30 DIAGNOSIS — I472 Ventricular tachycardia: Secondary | ICD-10-CM | POA: Insufficient documentation

## 2012-11-30 LAB — ICD DEVICE OBSERVATION
AL AMPLITUDE: 2.125 mv
AL THRESHOLD: 0.875 V
FVT: 0
LV LEAD THRESHOLD: 0.625 V
RV LEAD AMPLITUDE: 29 mv
RV LEAD IMPEDENCE ICD: 494 Ohm
RV LEAD THRESHOLD: 0.75 V
TZAT-0002ATACH: NEGATIVE
TZAT-0004ATACH: 15
TZAT-0004ATACH: 6
TZAT-0005FASTVT: 88 pct
TZAT-0005FASTVT: 91 pct
TZAT-0005SLOWVT: 91 pct
TZAT-0011ATACH: 10 ms
TZAT-0011FASTVT: 10 ms
TZAT-0011FASTVT: 10 ms
TZAT-0011SLOWVT: 10 ms
TZAT-0011SLOWVT: 10 ms
TZAT-0012ATACH: 150 ms
TZAT-0012SLOWVT: 200 ms
TZAT-0012SLOWVT: 200 ms
TZAT-0013SLOWVT: 2
TZAT-0018ATACH: NEGATIVE
TZAT-0018ATACH: NEGATIVE
TZAT-0018ATACH: NEGATIVE
TZAT-0018SLOWVT: NEGATIVE
TZAT-0018SLOWVT: NEGATIVE
TZAT-0019ATACH: 6 V
TZAT-0019ATACH: 6 V
TZAT-0019ATACH: 6 V
TZAT-0019FASTVT: 8 V
TZAT-0019FASTVT: 8 V
TZAT-0019SLOWVT: 8 V
TZAT-0019SLOWVT: 8 V
TZON-0003ATACH: 300 ms
TZON-0003SLOWVT: 380 ms
TZON-0003VSLOWVT: 420 ms
TZON-0004SLOWVT: 48
TZON-0005SLOWVT: 12
TZST-0001ATACH: 4
TZST-0001ATACH: 5
TZST-0001FASTVT: 4
TZST-0001SLOWVT: 5
TZST-0001SLOWVT: 6
TZST-0002ATACH: NEGATIVE
TZST-0002ATACH: NEGATIVE
TZST-0002SLOWVT: NEGATIVE
TZST-0003FASTVT: 25 J
TZST-0003FASTVT: 35 J
VF: 0

## 2012-11-30 MED ORDER — CARVEDILOL 6.25 MG PO TABS: 6.2500 mg | ORAL_TABLET | Freq: Two times a day (BID) | ORAL | Status: AC

## 2012-11-30 NOTE — Assessment & Plan Note (Signed)
Euvolemic. She is oxygen dependent with her COPD.

## 2012-11-30 NOTE — Assessment & Plan Note (Signed)
The patient has had intercurrent ventricular tachycardia. We have reprogrammed the device to try to increase the likelihood of successful ATP. We will continue her on a higher dose of carvedilol. Interestingly this event occurred in the context of a right shift in her heart rate distribution for reasons that are not clear. She had significant blood work drawn by Dr. Judithann Sheen; review of blood work from [place] in November demonstrated a GFR in the high 20s, a TSH of about 10, is as last need to be reviewed

## 2012-11-30 NOTE — Patient Instructions (Addendum)
Increase Carvedilol to 6.25 twice daily.  Will get most recent labs from Dr. Judithann Sheen Office.  Once Labs are obtained we will contact you and notify you when to Follow Up or if any changes need to be made.

## 2012-11-30 NOTE — Assessment & Plan Note (Signed)
Recurrent SVT by her device documented and treated

## 2012-11-30 NOTE — Assessment & Plan Note (Signed)
No intercurrent atrial fibrillation 

## 2012-11-30 NOTE — Progress Notes (Signed)
Patient Care Team: Marguarite Arbour, MD as PCP - General (Unknown Physician Specialty)   HPI  [name] is a 77 y.o. female seen in followup for congestive heart failure in the setting of ischemic cardiomyopathy. She is status post CRT-D implantation and notably has a gold device secondary to an allergy to the metal.  She has undergone pre-generator replacement Myoview scanning demonstrating an ejection fraction of 36% scarring in the anteroapical anteroseptal region without evidence of significant ischemia. The LV is dilated.  She has had intercurrent shock episode. This was previously interrogated and clarified a ventricular tachycardia. Carvedilol dose was increased.functional capacity is limited to improve with the care of pulmonary   Past Medical History  Diagnosis Date  . Chronic systolic heart failure   . Other specified forms of chronic ischemic heart disease   . CHF (congestive heart failure)   . Mechanical complication due to automatic implantable cardiac defibrillator     Medtronic, NO remote  . Hematoma     left thigh  . PAD (peripheral artery disease)   . Coronary artery disease   . MI (myocardial infarction)     x 2  . Atrioventricular block, complete   . Atrial fibrillation   . Paroxysmal supraventricular tachycardia   . Hypertension   . Hyperlipidemia   . Tobacco abuse     Past Surgical History  Procedure Laterality Date  . Coronary artery bypass graft      x 5   . Insert / replace / remove pacemaker  1998    Implantable Defibrillator MDT CRT    Current Outpatient Prescriptions  Medication Sig Dispense Refill  . allopurinol (ZYLOPRIM) 300 MG tablet Take 300 mg by mouth daily.      . carvedilol (COREG) 3.125 MG tablet 2 (two) times daily with a meal.       . losartan (COZAAR) 25 MG tablet Take 25 mg by mouth daily.      . mometasone (NASONEX) 50 MCG/ACT nasal spray Place 2 sprays into the nose daily.  17 g  2  . Multiple Vitamin (MULTIVITAMIN) tablet  Take 1 tablet by mouth daily.        . potassium chloride SA (K-DUR,KLOR-CON) 20 MEQ tablet Take 1 tablet (20 mEq total) by mouth daily.  30 tablet  6  . simvastatin (ZOCOR) 40 MG tablet Take 40 mg by mouth at bedtime.        Marland Kitchen tiotropium (SPIRIVA HANDIHALER) 18 MCG inhalation capsule Place 1 capsule (18 mcg total) into inhaler and inhale daily.  30 capsule  2  . torsemide (DEMADEX) 20 MG tablet Take 20 mg by mouth 2 (two) times daily.        No current facility-administered medications for this visit.    Allergies  Allergen Reactions  . Ketorolac Tromethamine   . Morphine And Related     vomiting  . Neomycin-Bacitracin Zn-Polymyx   . Penicillins   . Sulfonamide Derivatives   . Sulfur   . Tetanus Toxoid     Review of Systems negative except from HPI and PMH  Physical Exam BP 118/66  Pulse 95  Ht 5' 1.5" (1.562 m)  Wt 97 lb (43.999 kg)  BMI 18.03 kg/m2 Well developed and cachectic older woman in no distress wearing oxygenHENT normal E scleral and icterus clear Neck Supple JVP flat; carotids brisk and full Diffuse crackles regular rate and rhythm, early systolic murmur with an S4Soft with active bowel sounds No clubbing cyanosis no  Edema  Alert and oriented, grossly normal motor and sensory function Skin Warm and Dry    Assessment and  Plan

## 2012-11-30 NOTE — Assessment & Plan Note (Signed)
The patient's device was interrogated and the information was fully reviewed.  The device was reprogrammed to  Hopefully improve success ATP by creating a fast VT via VF zone with 3 episodes of ATP in the heart rate range of 200/240

## 2013-01-03 ENCOUNTER — Encounter: Payer: Self-pay | Admitting: Pulmonary Disease

## 2013-01-20 ENCOUNTER — Other Ambulatory Visit: Payer: Self-pay | Admitting: *Deleted

## 2013-01-20 MED ORDER — TIOTROPIUM BROMIDE MONOHYDRATE 18 MCG IN CAPS: 18.0000 ug | ORAL_CAPSULE | Freq: Every day | RESPIRATORY_TRACT | Status: DC

## 2013-03-07 ENCOUNTER — Encounter: Payer: Medicare Other | Admitting: *Deleted

## 2013-03-15 ENCOUNTER — Encounter: Payer: Self-pay | Admitting: *Deleted

## 2013-03-18 ENCOUNTER — Telehealth: Payer: Self-pay | Admitting: Pulmonary Disease

## 2013-03-18 MED ORDER — TIOTROPIUM BROMIDE MONOHYDRATE 18 MCG IN CAPS: 18.0000 ug | ORAL_CAPSULE | Freq: Every day | RESPIRATORY_TRACT | Status: AC

## 2013-03-18 NOTE — Telephone Encounter (Signed)
Spoke with patient-- Patient requesting refill of spiriva Rx sent to verified pharmacy and nothing further needed at this time

## 2013-03-21 IMAGING — US US EXTREM LOW VENOUS*L*
1 series · 14 of 24 positions shown · non-contrast
Comparison: none

REASON FOR EXAM: left thigh pain swelling ecchymosis
COMMENTS:

[Series 1: us extrem low venous*left* · 0.08mm/px · 14 of 27 slices shown]
[im 1/27]
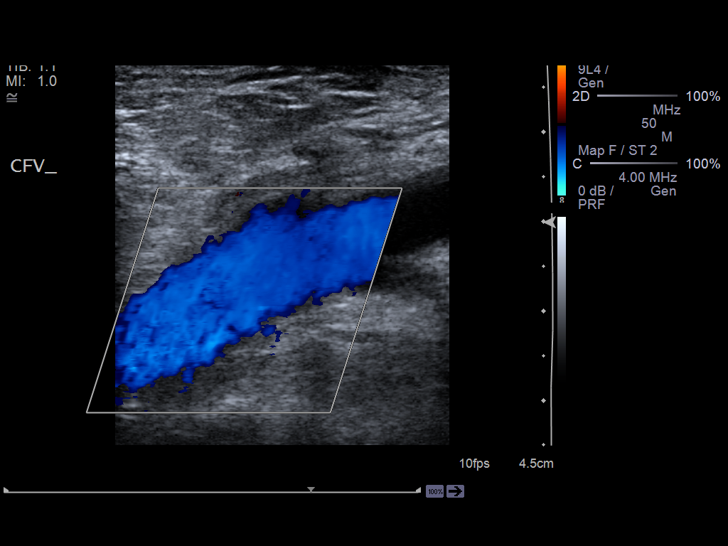
[im 3/27]
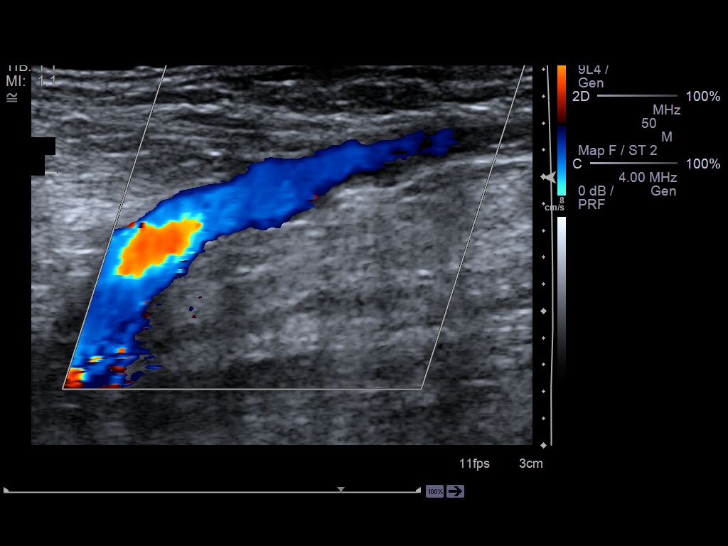
[im 5/27]
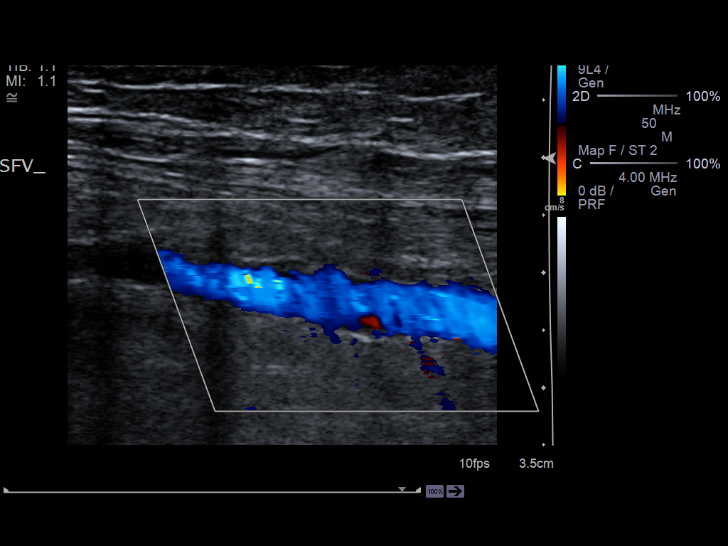
[im 7/27]
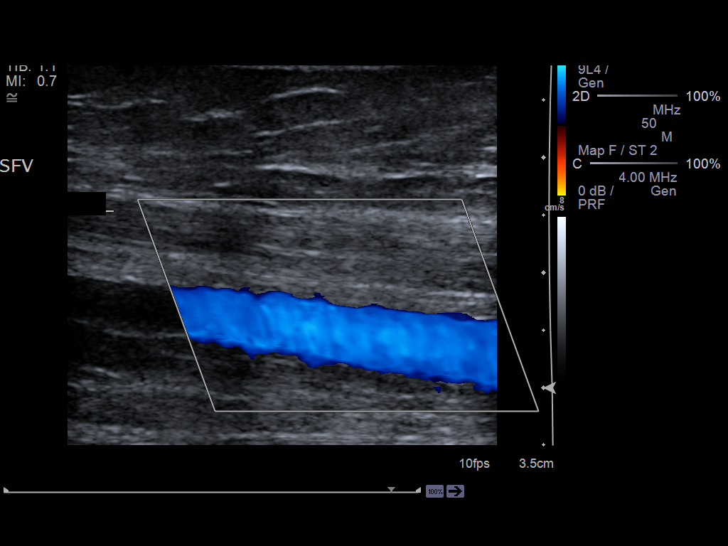
[im 8/27]
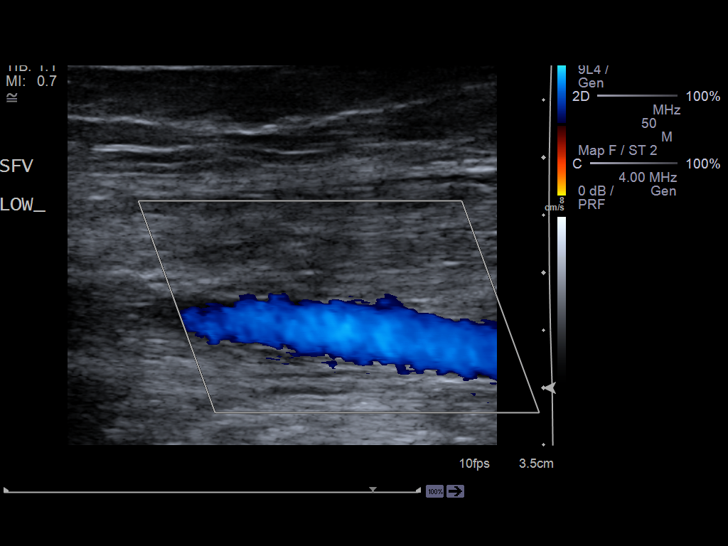
[im 11/27]
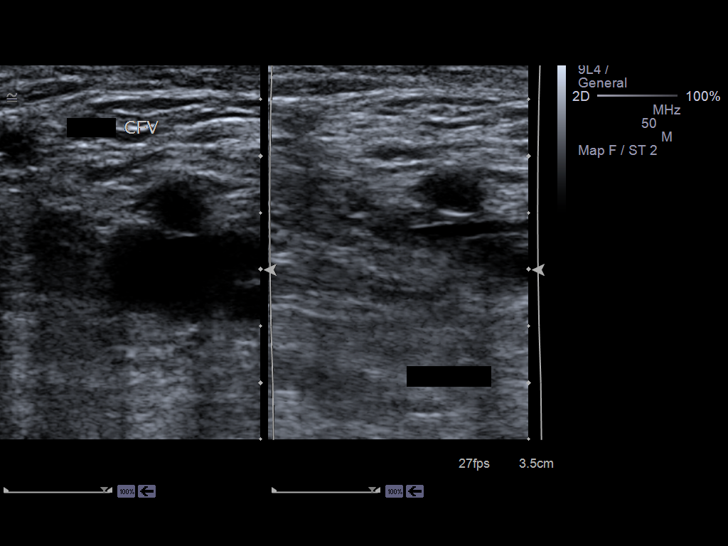
[im 13/27]
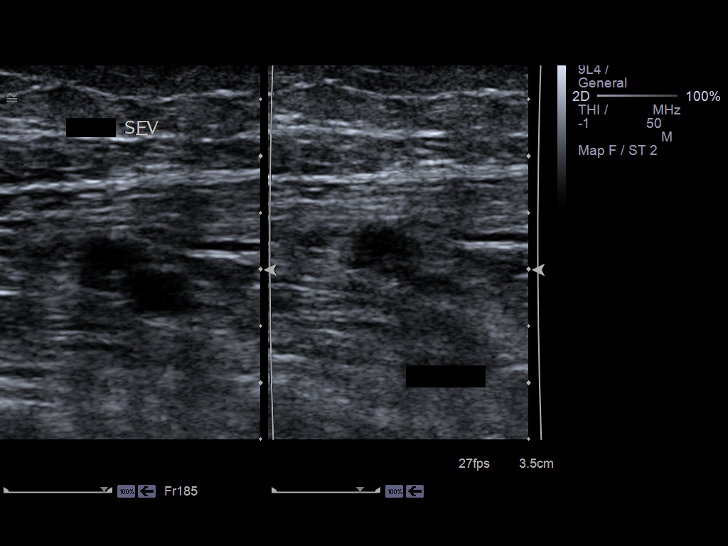
[im 14/27]
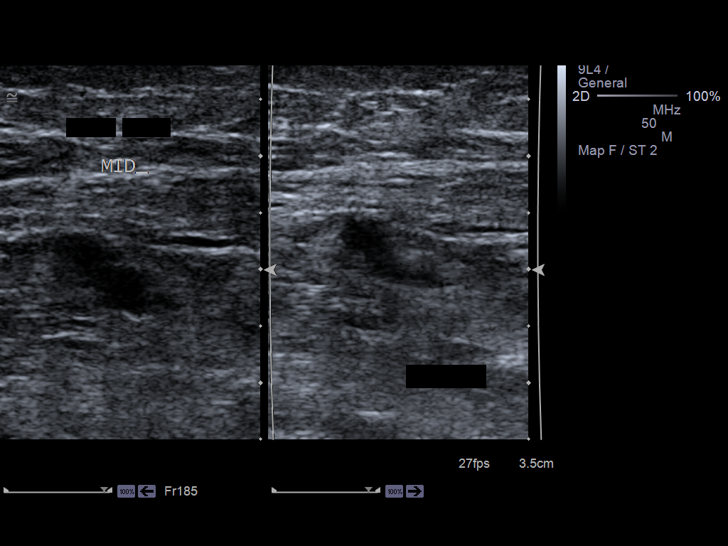
[im 16/27]
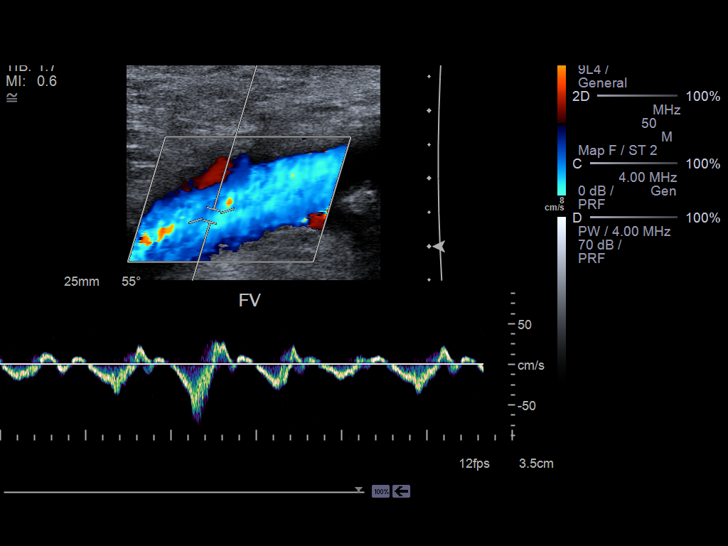
[im 19/27]
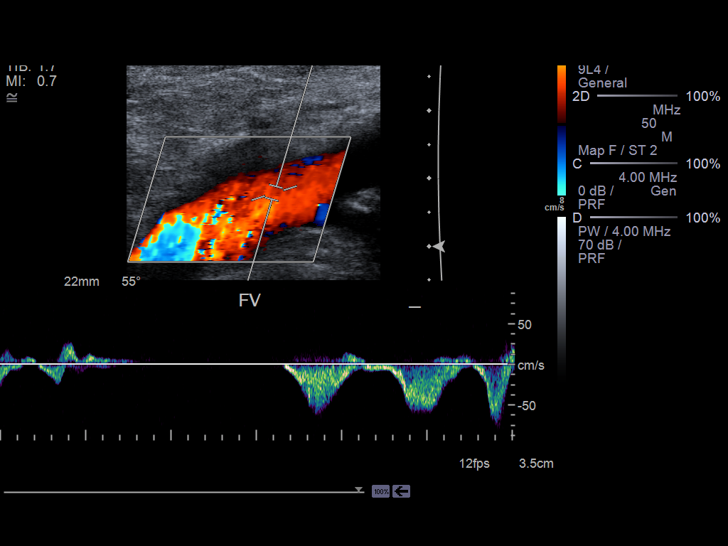
[im 21/27]
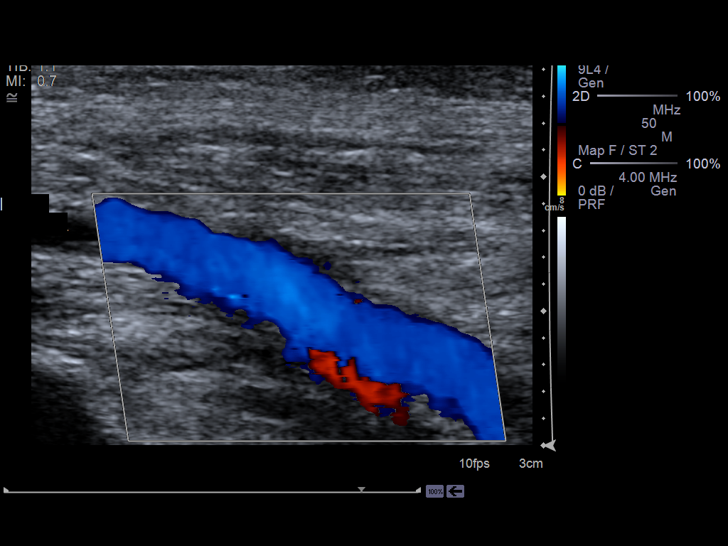
[im 22/27]
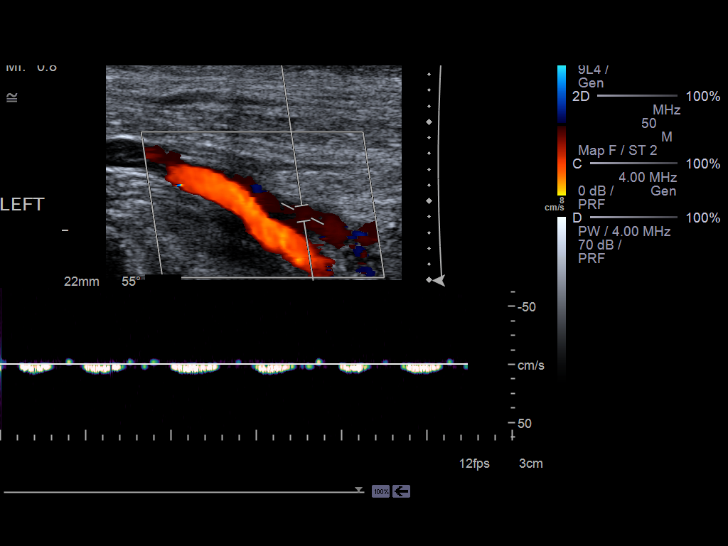
[im 24/27]
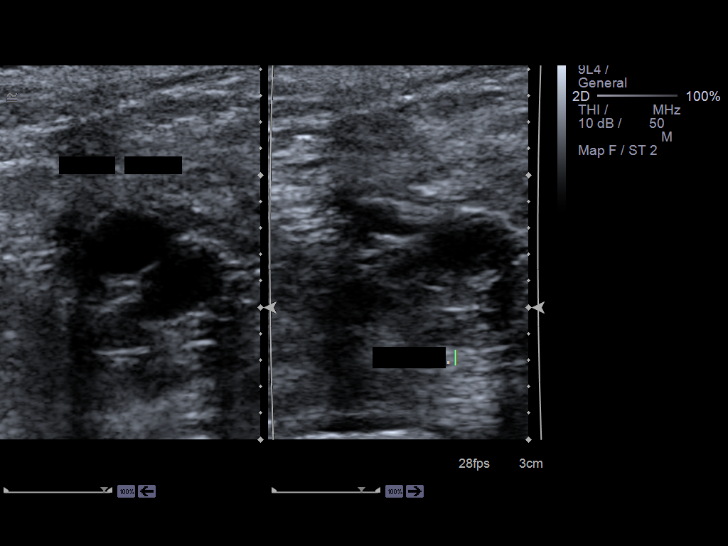
[im 27/27]
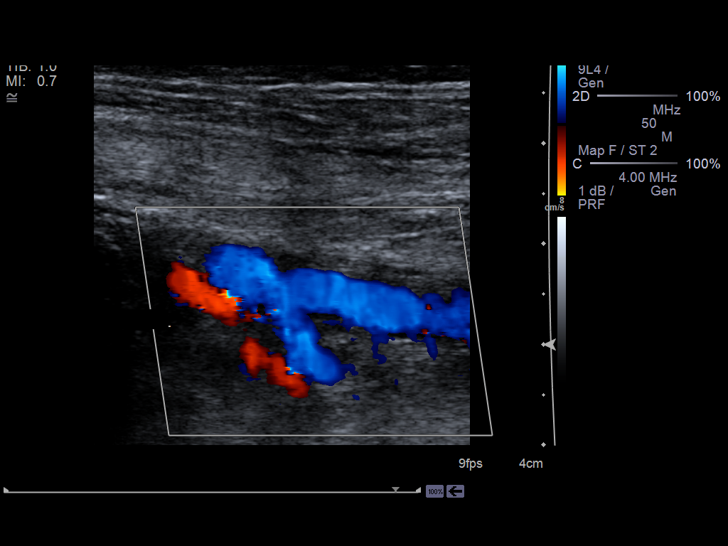

[14 of 24 positions shown; findings below may reference images not displayed]

PROCEDURE:     US  - US DOPPLER LOW EXTR LEFT  - June 09, 2012  [DATE]

RESULT:     Doppler interrogation of the deep venous system of the left leg
is performed from the common femoral vein through the popliteal vein. There
is no previous exam for comparison.

There is normal compressibility with grayscale compression images. The color
Doppler and spectral Doppler appearance is normal. There is no abnormal
fluid collection or adenopathy visualized.
IMPRESSION: No evidence of left lower extremity deep vein thrombosis.

[REDACTED]

## 2013-03-23 IMAGING — US US RENAL KIDNEY
1 series · 14 of 25 positions shown · non-contrast
Comparison: none

REASON FOR EXAM: acute renal failure
COMMENTS:

[Series 1: us renal kidney · 0.25mm/px · 14 of 28 slices shown]
[im 1/28]
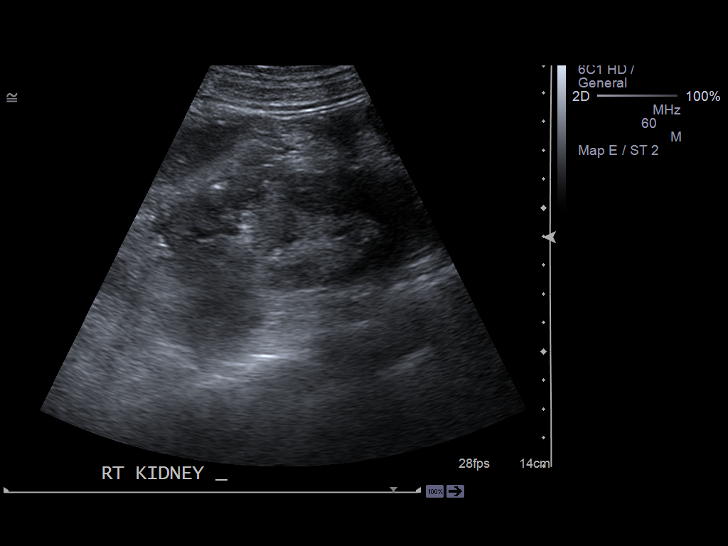
[im 3/28]
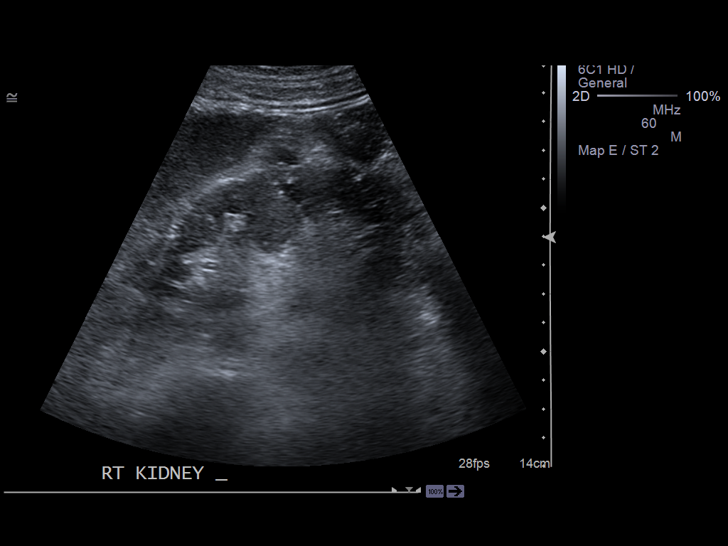
[im 5/28]
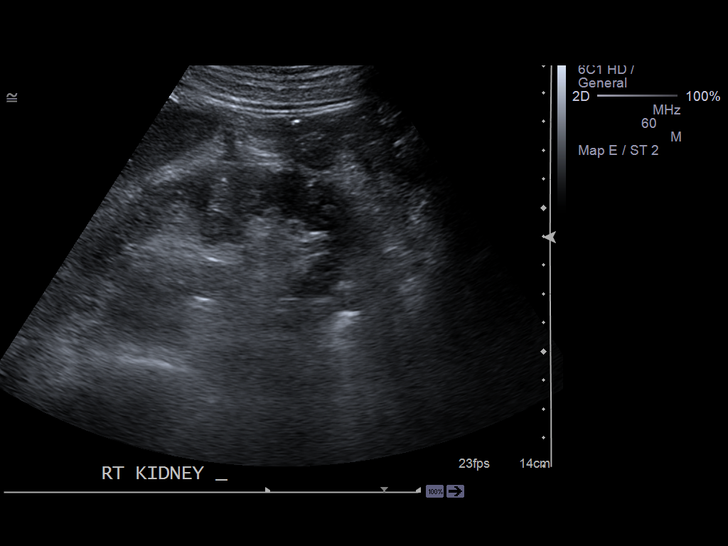
[im 7/28]
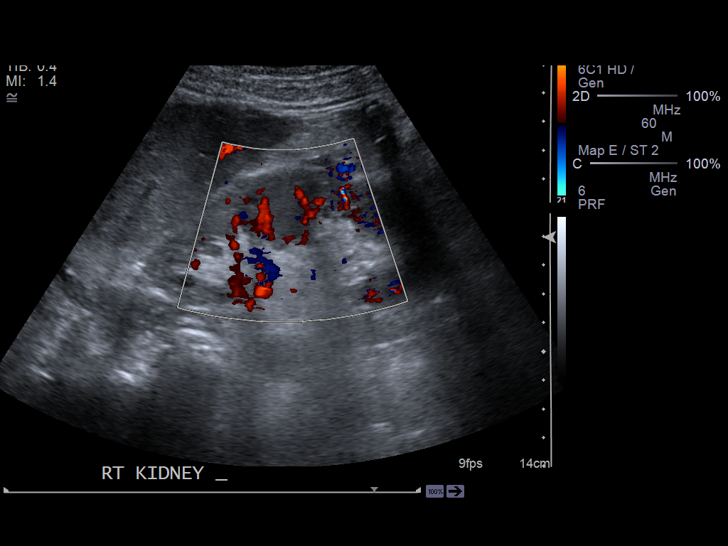
[im 10/28]
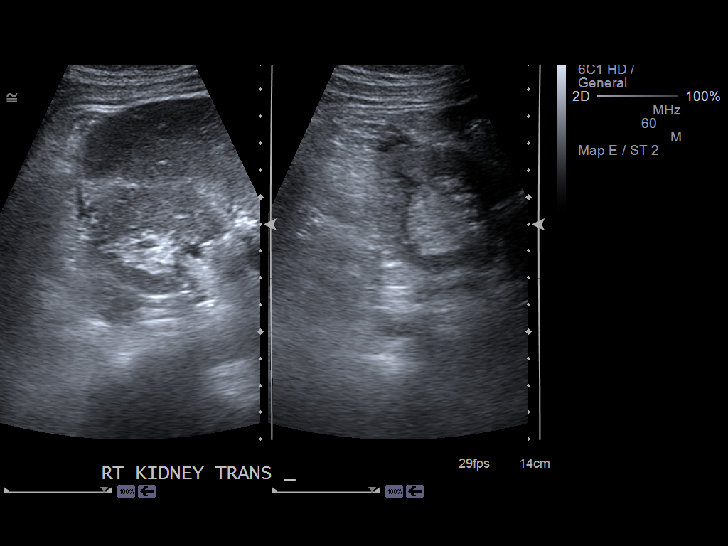
[im 11/28]
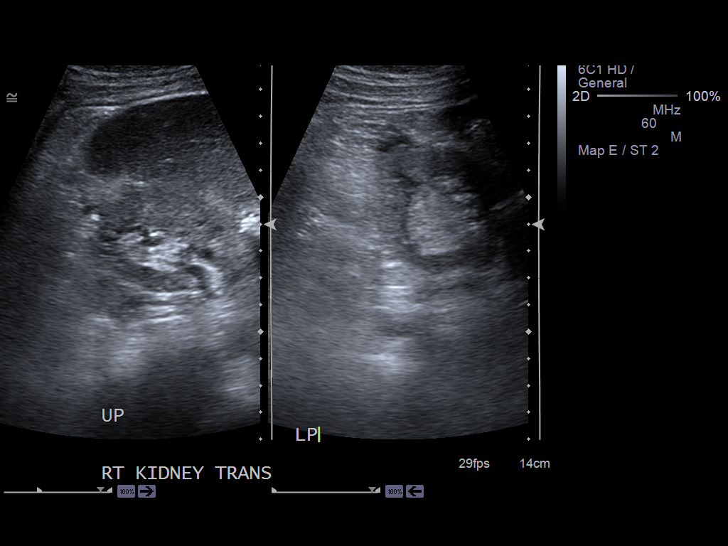
[im 13/28]
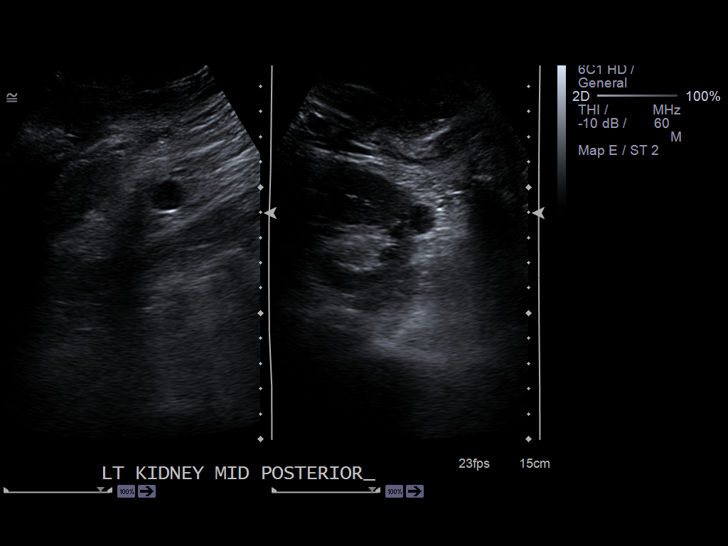
[im 15/28]
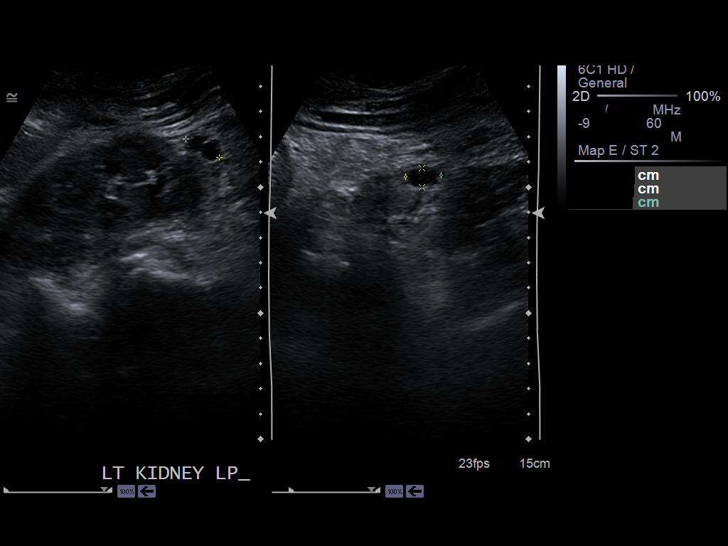
[im 17/28]
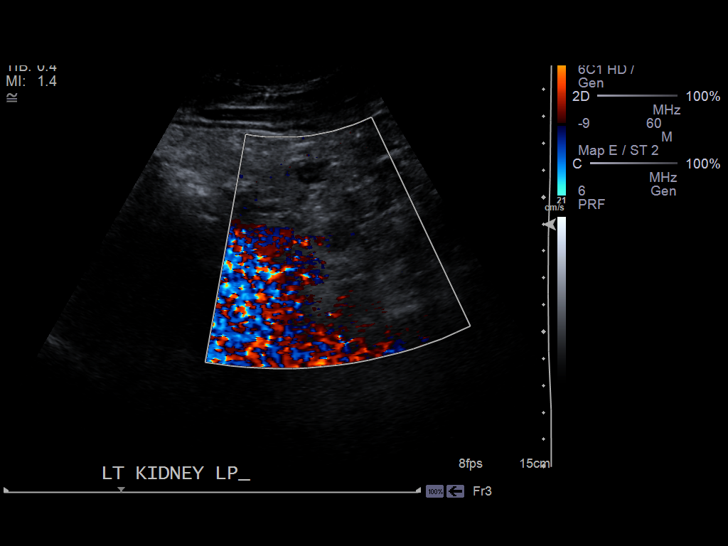
[im 19/28]
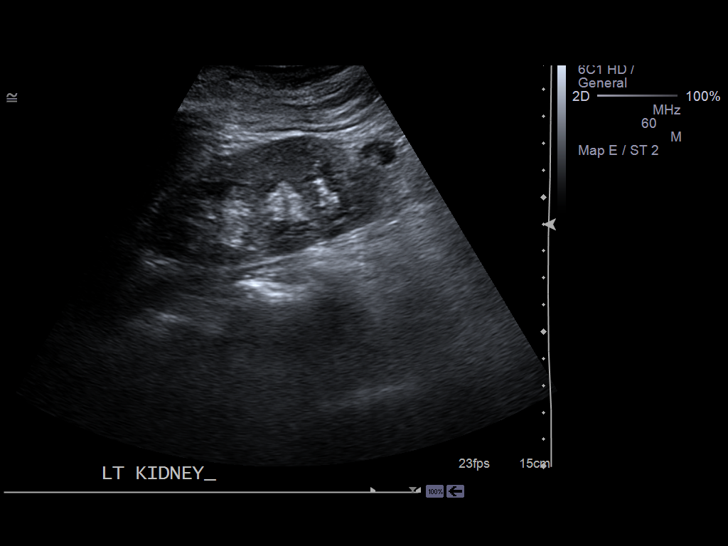
[im 21/28]
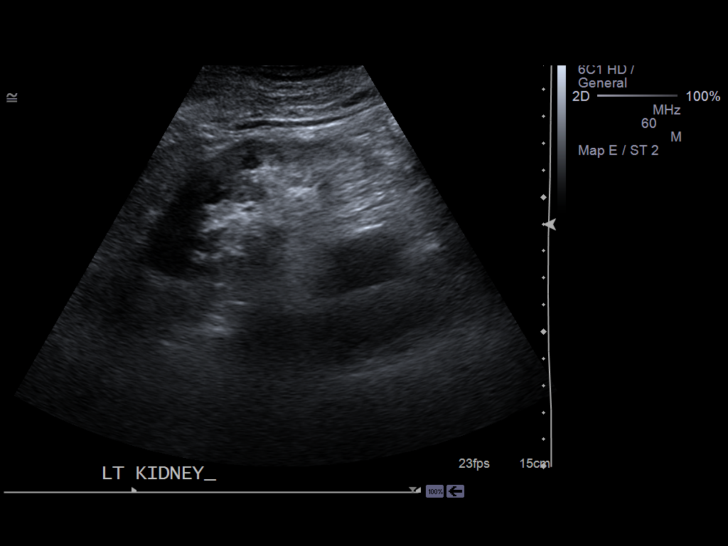
[im 23/28]
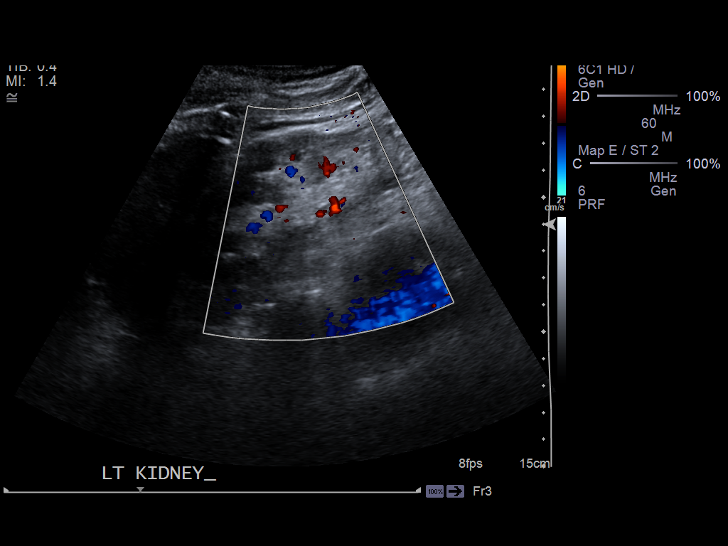
[im 25/28]
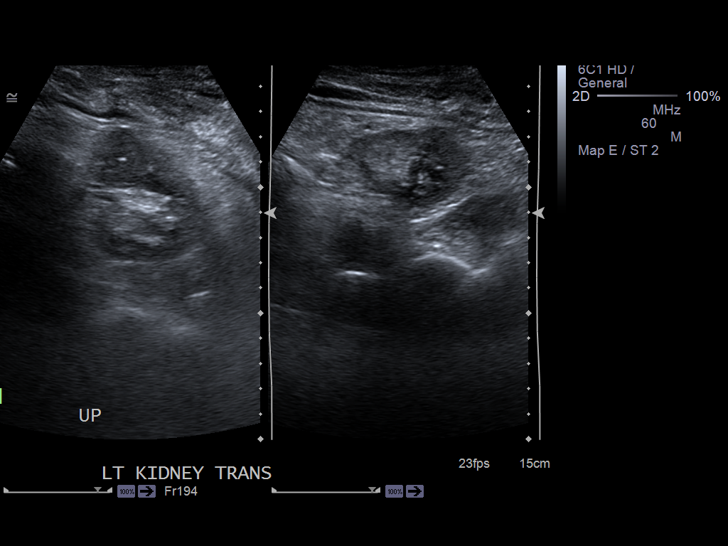
[im 28/28]
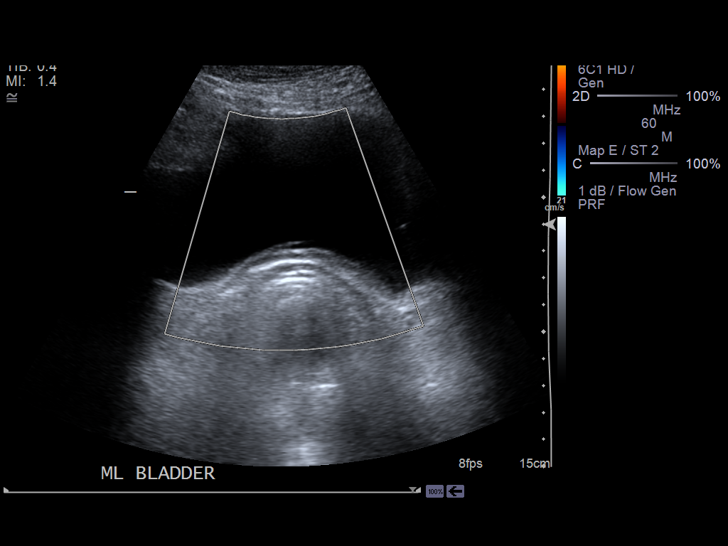

[14 of 25 positions shown; findings below may reference images not displayed]

PROCEDURE:     US  - US KIDNEY  - June 11, 2012  [DATE]

RESULT:     Right kidney measures 9.6 cm. Left kidney measures 9.2 cm.
Kidneys are mildly echogenic with thin cortex suggesting chronic renal
disease. Small amount of perinephric fluid noted about the right kidney. No
hydronephrosis. Tiny cyst left kidney. Bilateral renal blood flow. Bladder
nondistended.
IMPRESSION: Chronic renal disease as described above.

## 2013-03-29 ENCOUNTER — Ambulatory Visit (INDEPENDENT_AMBULATORY_CARE_PROVIDER_SITE_OTHER): Payer: Medicare Other | Admitting: Pulmonary Disease

## 2013-03-29 ENCOUNTER — Encounter: Payer: Self-pay | Admitting: Pulmonary Disease

## 2013-03-29 VITALS — BP 102/60 | HR 82 | Ht 61.0 in | Wt 93.0 lb

## 2013-03-29 DIAGNOSIS — Z23 Encounter for immunization: Secondary | ICD-10-CM

## 2013-03-29 DIAGNOSIS — J9611 Chronic respiratory failure with hypoxia: Secondary | ICD-10-CM

## 2013-03-29 DIAGNOSIS — J449 Chronic obstructive pulmonary disease, unspecified: Secondary | ICD-10-CM

## 2013-03-29 DIAGNOSIS — J9610 Chronic respiratory failure, unspecified whether with hypoxia or hypercapnia: Secondary | ICD-10-CM

## 2013-03-29 NOTE — Assessment & Plan Note (Signed)
This has been a stable interval for [name] without recurrent flares of her COPD.  Plan: -flu shot today -continue spiriva -apply for patient assistance with Spiriva -f/u one year

## 2013-03-29 NOTE — Assessment & Plan Note (Addendum)
Continue 2L O2 at night Walk on room air today showed persistent desaturation with ambulation. We instructed her that she needs to use her oxygen at night as well as with exertion at 2 L/min

## 2013-03-29 NOTE — Patient Instructions (Addendum)
Keep using your oxygen at night and your spiriva daily  We will see you back in one year or sooner if needed

## 2013-03-29 NOTE — Progress Notes (Signed)
Subjective:    Patient ID: [name], female    DOB: 1936-04-10, 77 y.o.   MRN: [id number]  Synopsis: [name] first saw the [facility] clinic in 08/2012 for evaluation of COPD.  Her 09/14/2012 Simple Spirometry> Ratio 52%, FEV1 0.68L (36% pred).  She was started on Spiriva and 2L O2 continuously.  HPI   10/26/2012 ROV -- [name] has been doing very well since starting Spiriva and using oxygen continuously. She has been having a lot of hip pain but despite this states that her shortness of breath has improved. She has not heard from pulmonary rehabilitation yet and so therefore she has not started there. She says her sinus congestion has improved since starting the Nasonex. Otherwise things have been stable.  03/29/2013 ROV >> [name] is having more dyspna with humidity and hot weather and then she has been using her oxygen more.  She carries her oxygen with her but doesn't often use it. Her cough isn't too bad.  She really likes the Spiriva as it continues to help.  Her copay justrecently went up. No episode of bronchitis or need for prednisone or an antibiotic since the last visit.    Past Medical History  Diagnosis Date  . Chronic systolic heart failure   . Other specified forms of chronic ischemic heart disease   . CHF (congestive heart failure)   . Mechanical complication due to automatic implantable cardiac defibrillator     [employer], NO remote  . Hematoma     left thigh  . PAD (peripheral artery disease)   . Coronary artery disease   . MI (myocardial infarction)     x 2  . Atrioventricular block, complete   . Atrial fibrillation   . Paroxysmal supraventricular tachycardia   . Hypertension   . Hyperlipidemia   . Tobacco abuse     Review of Systems  Constitutional: Negative for fever, chills and fatigue.  HENT: Negative for congestion, rhinorrhea, postnasal drip and sinus pressure.   Respiratory: Positive for shortness of breath. Negative for cough, wheezing and  stridor.   Cardiovascular: Negative for chest pain, palpitations and leg swelling.  Musculoskeletal: Positive for arthralgias.       Objective:   Physical Exam   Filed Vitals:   03/29/13 1544  BP: 102/60  Pulse: 82  Height: 5\' 1"  (1.549 m)  Weight: 93 lb (42.185 kg)  SpO2: 93%    Gen: chronically ill appearing, no acute distress HEENT: NCAT, EOMi, OP clear,  PULM: Poor air movement but clear CV: RRR, no mgr, no JVD AB: soft, nontender, no hsm Ext: warm, no edema, no clubbing, no cyanosis  09/14/2012 Simple Spirometry> Ratio 52%, FEV1 0.68L (36% pred)     Assessment & Plan:   COPD, Severe This has been a stable interval for [name] without recurrent flares of her COPD.  Plan: -flu shot today -continue spiriva -apply for patient assistance with Spiriva -f/u one year  Chronic hypoxemic respiratory failure Continue 2L O2 at night Walk on room air today showed persistent desaturation with ambulation. We instructed her that she needs to use her oxygen at night as well as with exertion at 2 L/min    Updated Medication List Outpatient Encounter Prescriptions as of 03/29/2013  Medication Sig Dispense Refill  . allopurinol (ZYLOPRIM) 300 MG tablet Take 300 mg by mouth daily.      . carvedilol (COREG) 6.25 MG tablet Take 1 tablet (6.25 mg total) by mouth 2 (two) times daily.  180 tablet  3  . losartan (COZAAR) 25 MG tablet Take 25 mg by mouth daily.      . mometasone (NASONEX) 50 MCG/ACT nasal spray Place 2 sprays into the nose daily.  17 g  2  . Multiple Vitamin (MULTIVITAMIN) tablet Take 1 tablet by mouth daily.        . potassium chloride SA (K-DUR,KLOR-CON) 20 MEQ tablet Take 1 tablet (20 mEq total) by mouth daily.  30 tablet  6  . simvastatin (ZOCOR) 40 MG tablet Take 40 mg by mouth at bedtime.        Marland Kitchen tiotropium (SPIRIVA HANDIHALER) 18 MCG inhalation capsule Place 1 capsule (18 mcg total) into inhaler and inhale daily.  30 capsule  3  . torsemide (DEMADEX) 20 MG  tablet Take 20 mg by mouth 2 (two) times daily.        No facility-administered encounter medications on file as of 03/29/2013.

## 2013-06-17 ENCOUNTER — Telehealth: Payer: Self-pay | Admitting: Pulmonary Disease

## 2013-06-17 NOTE — Telephone Encounter (Addendum)
Pt states that Spiriva was making her not feel well so went back to advair and wants to confirm that this is o.k with you.  Pt last seen in 09/2012.  I don't see where they were on advair or what the strength was.  Please advise if this is ok or what you would like her to do.

## 2013-06-17 NOTE — Telephone Encounter (Signed)
This patient switched herself from spiriva back to advair.  Is this ok?

## 2013-06-17 NOTE — Telephone Encounter (Signed)
Fine my be

## 2013-06-24 NOTE — Telephone Encounter (Signed)
I called and made pt aware [name] was fine with her going back to advair. Nothing further needed

## 2013-07-03 LAB — CBC
HCT: 35.5 % (ref 35.0–47.0)
HGB: 11.4 g/dL — ABNORMAL LOW (ref 12.0–16.0)
MCH: 30.1 pg (ref 26.0–34.0)
MCHC: 32.2 g/dL (ref 32.0–36.0)
MCV: 93 fL (ref 80–100)
Platelet: 146 10*3/uL — ABNORMAL LOW (ref 150–440)
RBC: 3.8 10*6/uL (ref 3.80–5.20)
RDW: 16 % — ABNORMAL HIGH (ref 11.5–14.5)
WBC: 9.8 10*3/uL (ref 3.6–11.0)

## 2013-07-04 ENCOUNTER — Inpatient Hospital Stay: Payer: Self-pay | Admitting: Internal Medicine

## 2013-07-04 LAB — COMPREHENSIVE METABOLIC PANEL
ALBUMIN: 3.3 g/dL — AB (ref 3.4–5.0)
ALK PHOS: 138 U/L — AB
ANION GAP: 4 — AB (ref 7–16)
AST: 40 U/L — AB (ref 15–37)
BILIRUBIN TOTAL: 0.5 mg/dL (ref 0.2–1.0)
BUN: 38 mg/dL — AB (ref 7–18)
CO2: 32 mmol/L (ref 21–32)
Calcium, Total: 9.2 mg/dL (ref 8.5–10.1)
Chloride: 97 mmol/L — ABNORMAL LOW (ref 98–107)
Creatinine: 0.87 mg/dL (ref 0.60–1.30)
GLUCOSE: 118 mg/dL — AB (ref 65–99)
OSMOLALITY: 277 (ref 275–301)
Potassium: 4 mmol/L (ref 3.5–5.1)
SGPT (ALT): 46 U/L (ref 12–78)
SODIUM: 133 mmol/L — AB (ref 136–145)
TOTAL PROTEIN: 7.5 g/dL (ref 6.4–8.2)

## 2013-07-04 LAB — PROTIME-INR
INR: 1.1
PROTHROMBIN TIME: 14.4 s (ref 11.5–14.7)

## 2013-07-04 LAB — CK TOTAL AND CKMB (NOT AT ARMC)
CK, Total: 57 U/L (ref 21–215)
CK-MB: 4.1 ng/mL — ABNORMAL HIGH (ref 0.5–3.6)

## 2013-07-04 LAB — PRO B NATRIURETIC PEPTIDE: B-Type Natriuretic Peptide: 6718 pg/mL — ABNORMAL HIGH (ref 0–450)

## 2013-07-04 LAB — TROPONIN I: TROPONIN-I: 0.08 ng/mL — AB

## 2013-07-05 LAB — BASIC METABOLIC PANEL
Anion Gap: 4 — ABNORMAL LOW (ref 7–16)
BUN: 40 mg/dL — ABNORMAL HIGH (ref 7–18)
CHLORIDE: 96 mmol/L — AB (ref 98–107)
CO2: 32 mmol/L (ref 21–32)
Calcium, Total: 9.6 mg/dL (ref 8.5–10.1)
Creatinine: 1.17 mg/dL (ref 0.60–1.30)
EGFR (Non-African Amer.): 46 — ABNORMAL LOW
GFR CALC AF AMER: 53 — AB
Glucose: 137 mg/dL — ABNORMAL HIGH (ref 65–99)
OSMOLALITY: 276 (ref 275–301)
POTASSIUM: 3.7 mmol/L (ref 3.5–5.1)
SODIUM: 132 mmol/L — AB (ref 136–145)

## 2013-07-05 LAB — CBC WITH DIFFERENTIAL/PLATELET
BASOS PCT: 0 %
Basophil #: 0 10*3/uL (ref 0.0–0.1)
EOS ABS: 0 10*3/uL (ref 0.0–0.7)
Eosinophil %: 0 %
HCT: 32.2 % — ABNORMAL LOW (ref 35.0–47.0)
HGB: 10.4 g/dL — ABNORMAL LOW (ref 12.0–16.0)
Lymphocyte #: 0.3 10*3/uL — ABNORMAL LOW (ref 1.0–3.6)
Lymphocyte %: 2.8 %
MCH: 29.6 pg (ref 26.0–34.0)
MCHC: 32.3 g/dL (ref 32.0–36.0)
MCV: 92 fL (ref 80–100)
Monocyte #: 0.2 x10 3/mm (ref 0.2–0.9)
Monocyte %: 1.8 %
NEUTROS ABS: 11.8 10*3/uL — AB (ref 1.4–6.5)
Neutrophil %: 95.4 %
Platelet: 138 10*3/uL — ABNORMAL LOW (ref 150–440)
RBC: 3.51 10*6/uL — ABNORMAL LOW (ref 3.80–5.20)
RDW: 16.1 % — ABNORMAL HIGH (ref 11.5–14.5)
WBC: 12.4 10*3/uL — AB (ref 3.6–11.0)

## 2013-07-06 LAB — CBC WITH DIFFERENTIAL/PLATELET
BASOS ABS: 0 10*3/uL (ref 0.0–0.1)
Basophil %: 0.2 %
EOS ABS: 0 10*3/uL (ref 0.0–0.7)
Eosinophil %: 0 %
HCT: 33.4 % — ABNORMAL LOW (ref 35.0–47.0)
HGB: 10.7 g/dL — ABNORMAL LOW (ref 12.0–16.0)
LYMPHS ABS: 0.4 10*3/uL — AB (ref 1.0–3.6)
LYMPHS PCT: 2.7 %
MCH: 29.8 pg (ref 26.0–34.0)
MCHC: 32.2 g/dL (ref 32.0–36.0)
MCV: 93 fL (ref 80–100)
MONO ABS: 0.6 x10 3/mm (ref 0.2–0.9)
Monocyte %: 4.3 %
NEUTROS PCT: 92.8 %
Neutrophil #: 12.6 10*3/uL — ABNORMAL HIGH (ref 1.4–6.5)
Platelet: 151 10*3/uL (ref 150–440)
RBC: 3.6 10*6/uL — ABNORMAL LOW (ref 3.80–5.20)
RDW: 16.1 % — ABNORMAL HIGH (ref 11.5–14.5)
WBC: 13.5 10*3/uL — ABNORMAL HIGH (ref 3.6–11.0)

## 2013-07-06 LAB — BASIC METABOLIC PANEL
Anion Gap: 2 — ABNORMAL LOW (ref 7–16)
BUN: 45 mg/dL — ABNORMAL HIGH (ref 7–18)
CALCIUM: 8.7 mg/dL (ref 8.5–10.1)
CHLORIDE: 96 mmol/L — AB (ref 98–107)
CO2: 34 mmol/L — AB (ref 21–32)
CREATININE: 1.18 mg/dL (ref 0.60–1.30)
GFR CALC AF AMER: 52 — AB
GFR CALC NON AF AMER: 45 — AB
Glucose: 134 mg/dL — ABNORMAL HIGH (ref 65–99)
Osmolality: 278 (ref 275–301)
Potassium: 5 mmol/L (ref 3.5–5.1)
SODIUM: 132 mmol/L — AB (ref 136–145)

## 2013-07-09 LAB — CULTURE, BLOOD (SINGLE)

## 2013-07-12 ENCOUNTER — Ambulatory Visit: Payer: Medicare Other | Admitting: Pulmonary Disease

## 2013-07-31 ENCOUNTER — Ambulatory Visit: Payer: Self-pay | Admitting: Internal Medicine

## 2013-08-21 LAB — CBC
HCT: 34 % — ABNORMAL LOW (ref 35.0–47.0)
HGB: 11 g/dL — AB (ref 12.0–16.0)
MCH: 30.6 pg (ref 26.0–34.0)
MCHC: 32.3 g/dL (ref 32.0–36.0)
MCV: 95 fL (ref 80–100)
Platelet: 249 10*3/uL (ref 150–440)
RBC: 3.59 10*6/uL — ABNORMAL LOW (ref 3.80–5.20)
RDW: 16.4 % — AB (ref 11.5–14.5)
WBC: 8.2 10*3/uL (ref 3.6–11.0)

## 2013-08-22 ENCOUNTER — Inpatient Hospital Stay: Payer: Self-pay | Admitting: Internal Medicine

## 2013-08-22 LAB — BASIC METABOLIC PANEL
ANION GAP: 7 (ref 7–16)
BUN: 41 mg/dL — ABNORMAL HIGH (ref 7–18)
CALCIUM: 8.7 mg/dL (ref 8.5–10.1)
CHLORIDE: 98 mmol/L (ref 98–107)
CO2: 31 mmol/L (ref 21–32)
Creatinine: 1.25 mg/dL (ref 0.60–1.30)
EGFR (African American): 49 — ABNORMAL LOW
EGFR (Non-African Amer.): 42 — ABNORMAL LOW
GLUCOSE: 120 mg/dL — AB (ref 65–99)
Osmolality: 283 (ref 275–301)
POTASSIUM: 3.9 mmol/L (ref 3.5–5.1)
SODIUM: 136 mmol/L (ref 136–145)

## 2013-08-22 LAB — CK-MB: CK-MB: 1.2 ng/mL (ref 0.5–3.6)

## 2013-08-22 LAB — COMPREHENSIVE METABOLIC PANEL
ALT: 33 U/L (ref 12–78)
ANION GAP: 4 — AB (ref 7–16)
Albumin: 2.9 g/dL — ABNORMAL LOW (ref 3.4–5.0)
Alkaline Phosphatase: 137 U/L — ABNORMAL HIGH
BUN: 43 mg/dL — ABNORMAL HIGH (ref 7–18)
Bilirubin,Total: 0.5 mg/dL (ref 0.2–1.0)
CALCIUM: 8.6 mg/dL (ref 8.5–10.1)
CO2: 30 mmol/L (ref 21–32)
Chloride: 98 mmol/L (ref 98–107)
Creatinine: 1.29 mg/dL (ref 0.60–1.30)
EGFR (African American): 47 — ABNORMAL LOW
GFR CALC NON AF AMER: 40 — AB
Glucose: 176 mg/dL — ABNORMAL HIGH (ref 65–99)
Osmolality: 280 (ref 275–301)
Potassium: 4.6 mmol/L (ref 3.5–5.1)
SGOT(AST): 43 U/L — ABNORMAL HIGH (ref 15–37)
Sodium: 132 mmol/L — ABNORMAL LOW (ref 136–145)
Total Protein: 7.1 g/dL (ref 6.4–8.2)

## 2013-08-22 LAB — CK TOTAL AND CKMB (NOT AT ARMC)
CK, TOTAL: 23 U/L — AB
CK, Total: 23 U/L — ABNORMAL LOW
CK-MB: 1.4 ng/mL (ref 0.5–3.6)
CK-MB: 1.6 ng/mL (ref 0.5–3.6)

## 2013-08-22 LAB — MAGNESIUM: MAGNESIUM: 2.1 mg/dL

## 2013-08-22 LAB — TROPONIN I
TROPONIN-I: 0.1 ng/mL — AB
TROPONIN-I: 0.08 ng/mL — AB
Troponin-I: 0.07 ng/mL — ABNORMAL HIGH
Troponin-I: 0.07 ng/mL — ABNORMAL HIGH

## 2013-08-22 LAB — PRO B NATRIURETIC PEPTIDE: B-TYPE NATIURETIC PEPTID: 28086 pg/mL — AB (ref 0–450)

## 2013-08-23 LAB — CBC WITH DIFFERENTIAL/PLATELET
BASOS ABS: 0 10*3/uL (ref 0.0–0.1)
BASOS PCT: 0.5 %
Eosinophil #: 0 10*3/uL (ref 0.0–0.7)
Eosinophil %: 0 %
HCT: 31 % — ABNORMAL LOW (ref 35.0–47.0)
HGB: 10.1 g/dL — ABNORMAL LOW (ref 12.0–16.0)
Lymphocyte #: 0.2 10*3/uL — ABNORMAL LOW (ref 1.0–3.6)
Lymphocyte %: 3.3 %
MCH: 30.5 pg (ref 26.0–34.0)
MCHC: 32.7 g/dL (ref 32.0–36.0)
MCV: 93 fL (ref 80–100)
Monocyte #: 0.3 x10 3/mm (ref 0.2–0.9)
Monocyte %: 4.3 %
Neutrophil #: 6.4 10*3/uL (ref 1.4–6.5)
Neutrophil %: 91.9 %
PLATELETS: 192 10*3/uL (ref 150–440)
RBC: 3.32 10*6/uL — AB (ref 3.80–5.20)
RDW: 15.9 % — AB (ref 11.5–14.5)
WBC: 6.9 10*3/uL (ref 3.6–11.0)

## 2013-08-23 LAB — BASIC METABOLIC PANEL
Anion Gap: 3 — ABNORMAL LOW (ref 7–16)
BUN: 45 mg/dL — AB (ref 7–18)
Calcium, Total: 8.5 mg/dL (ref 8.5–10.1)
Chloride: 98 mmol/L (ref 98–107)
Co2: 35 mmol/L — ABNORMAL HIGH (ref 21–32)
Creatinine: 1.2 mg/dL (ref 0.60–1.30)
EGFR (African American): 51 — ABNORMAL LOW
GFR CALC NON AF AMER: 44 — AB
Glucose: 143 mg/dL — ABNORMAL HIGH (ref 65–99)
OSMOLALITY: 286 (ref 275–301)
Potassium: 4.3 mmol/L (ref 3.5–5.1)
Sodium: 136 mmol/L (ref 136–145)

## 2013-08-24 LAB — CBC WITH DIFFERENTIAL/PLATELET
Basophil #: 0 10*3/uL (ref 0.0–0.1)
Basophil %: 0.3 %
EOS PCT: 0 %
Eosinophil #: 0 10*3/uL (ref 0.0–0.7)
HCT: 28.4 % — AB (ref 35.0–47.0)
HGB: 9.4 g/dL — AB (ref 12.0–16.0)
LYMPHS ABS: 0.3 10*3/uL — AB (ref 1.0–3.6)
Lymphocyte %: 2.3 %
MCH: 30.9 pg (ref 26.0–34.0)
MCHC: 33 g/dL (ref 32.0–36.0)
MCV: 94 fL (ref 80–100)
MONO ABS: 0.4 x10 3/mm (ref 0.2–0.9)
MONOS PCT: 3.3 %
NEUTROS ABS: 10.5 10*3/uL — AB (ref 1.4–6.5)
NEUTROS PCT: 94.1 %
Platelet: 169 10*3/uL (ref 150–440)
RBC: 3.03 10*6/uL — ABNORMAL LOW (ref 3.80–5.20)
RDW: 16.1 % — ABNORMAL HIGH (ref 11.5–14.5)
WBC: 11.2 10*3/uL — ABNORMAL HIGH (ref 3.6–11.0)

## 2013-08-24 LAB — BASIC METABOLIC PANEL
ANION GAP: 5 — AB (ref 7–16)
BUN: 55 mg/dL — ABNORMAL HIGH (ref 7–18)
CREATININE: 1.6 mg/dL — AB (ref 0.60–1.30)
Calcium, Total: 8.2 mg/dL — ABNORMAL LOW (ref 8.5–10.1)
Chloride: 100 mmol/L (ref 98–107)
Co2: 29 mmol/L (ref 21–32)
EGFR (African American): 36 — ABNORMAL LOW
EGFR (Non-African Amer.): 31 — ABNORMAL LOW
Glucose: 123 mg/dL — ABNORMAL HIGH (ref 65–99)
OSMOLALITY: 285 (ref 275–301)
Potassium: 4.3 mmol/L (ref 3.5–5.1)
Sodium: 134 mmol/L — ABNORMAL LOW (ref 136–145)

## 2013-08-25 LAB — CBC WITH DIFFERENTIAL/PLATELET
BASOS ABS: 0 10*3/uL (ref 0.0–0.1)
BASOS PCT: 0.6 %
EOS ABS: 0 10*3/uL (ref 0.0–0.7)
EOS PCT: 0.1 %
HCT: 29.9 % — AB (ref 35.0–47.0)
HGB: 9.9 g/dL — ABNORMAL LOW (ref 12.0–16.0)
LYMPHS PCT: 3.6 %
Lymphocyte #: 0.2 10*3/uL — ABNORMAL LOW (ref 1.0–3.6)
MCH: 30.9 pg (ref 26.0–34.0)
MCHC: 33.2 g/dL (ref 32.0–36.0)
MCV: 93 fL (ref 80–100)
Monocyte #: 0.2 x10 3/mm (ref 0.2–0.9)
Monocyte %: 3.1 %
NEUTROS PCT: 92.6 %
Neutrophil #: 5.5 10*3/uL (ref 1.4–6.5)
PLATELETS: 158 10*3/uL (ref 150–440)
RBC: 3.22 10*6/uL — AB (ref 3.80–5.20)
RDW: 16.1 % — ABNORMAL HIGH (ref 11.5–14.5)
WBC: 5.9 10*3/uL (ref 3.6–11.0)

## 2013-08-25 LAB — BASIC METABOLIC PANEL
Anion Gap: 5 — ABNORMAL LOW (ref 7–16)
BUN: 49 mg/dL — ABNORMAL HIGH (ref 7–18)
CHLORIDE: 106 mmol/L (ref 98–107)
CREATININE: 1.22 mg/dL (ref 0.60–1.30)
Calcium, Total: 8.2 mg/dL — ABNORMAL LOW (ref 8.5–10.1)
Co2: 29 mmol/L (ref 21–32)
EGFR (African American): 50 — ABNORMAL LOW
GFR CALC NON AF AMER: 43 — AB
Glucose: 117 mg/dL — ABNORMAL HIGH (ref 65–99)
OSMOLALITY: 293 (ref 275–301)
POTASSIUM: 4.4 mmol/L (ref 3.5–5.1)
SODIUM: 140 mmol/L (ref 136–145)

## 2013-08-25 LAB — MAGNESIUM: Magnesium: 2.3 mg/dL

## 2013-08-25 LAB — PHOSPHORUS: Phosphorus: 3.8 mg/dL (ref 2.5–4.9)

## 2013-08-25 LAB — TSH: Thyroid Stimulating Horm: 0.555 u[IU]/mL

## 2013-08-26 LAB — BASIC METABOLIC PANEL
Anion Gap: 2 — ABNORMAL LOW (ref 7–16)
BUN: 51 mg/dL — AB (ref 7–18)
CHLORIDE: 109 mmol/L — AB (ref 98–107)
Calcium, Total: 7.9 mg/dL — ABNORMAL LOW (ref 8.5–10.1)
Co2: 28 mmol/L (ref 21–32)
Creatinine: 1.11 mg/dL (ref 0.60–1.30)
EGFR (Non-African Amer.): 49 — ABNORMAL LOW
GFR CALC AF AMER: 56 — AB
Glucose: 139 mg/dL — ABNORMAL HIGH (ref 65–99)
OSMOLALITY: 293 (ref 275–301)
POTASSIUM: 4.3 mmol/L (ref 3.5–5.1)
Sodium: 139 mmol/L (ref 136–145)

## 2013-08-26 LAB — CBC WITH DIFFERENTIAL/PLATELET
Basophil #: 0 10*3/uL (ref 0.0–0.1)
Basophil %: 0.3 %
EOS ABS: 0 10*3/uL (ref 0.0–0.7)
Eosinophil %: 0 %
HCT: 31.2 % — ABNORMAL LOW (ref 35.0–47.0)
HGB: 10.3 g/dL — ABNORMAL LOW (ref 12.0–16.0)
Lymphocyte #: 0.2 10*3/uL — ABNORMAL LOW (ref 1.0–3.6)
Lymphocyte %: 2.4 %
MCH: 30.6 pg (ref 26.0–34.0)
MCHC: 33 g/dL (ref 32.0–36.0)
MCV: 93 fL (ref 80–100)
MONOS PCT: 6.2 %
Monocyte #: 0.5 x10 3/mm (ref 0.2–0.9)
NEUTROS PCT: 91.1 %
Neutrophil #: 7 10*3/uL — ABNORMAL HIGH (ref 1.4–6.5)
Platelet: 163 10*3/uL (ref 150–440)
RBC: 3.36 10*6/uL — AB (ref 3.80–5.20)
RDW: 16 % — AB (ref 11.5–14.5)
WBC: 7.7 10*3/uL (ref 3.6–11.0)

## 2013-08-26 LAB — CLOSTRIDIUM DIFFICILE(ARMC)

## 2013-08-26 LAB — MAGNESIUM: MAGNESIUM: 2.2 mg/dL

## 2013-08-26 LAB — PHOSPHORUS: Phosphorus: 2.9 mg/dL (ref 2.5–4.9)

## 2013-08-27 LAB — CBC WITH DIFFERENTIAL/PLATELET
BASOS PCT: 0.3 %
Basophil #: 0 10*3/uL (ref 0.0–0.1)
Eosinophil #: 0 10*3/uL (ref 0.0–0.7)
Eosinophil %: 0 %
HCT: 34.9 % — AB (ref 35.0–47.0)
HGB: 11 g/dL — AB (ref 12.0–16.0)
Lymphocyte #: 0.4 10*3/uL — ABNORMAL LOW (ref 1.0–3.6)
Lymphocyte %: 4.4 %
MCH: 29.6 pg (ref 26.0–34.0)
MCHC: 31.6 g/dL — AB (ref 32.0–36.0)
MCV: 94 fL (ref 80–100)
MONO ABS: 0.5 x10 3/mm (ref 0.2–0.9)
MONOS PCT: 5.8 %
Neutrophil #: 7.3 10*3/uL — ABNORMAL HIGH (ref 1.4–6.5)
Neutrophil %: 89.5 %
Platelet: 170 10*3/uL (ref 150–440)
RBC: 3.72 10*6/uL — AB (ref 3.80–5.20)
RDW: 16 % — AB (ref 11.5–14.5)
WBC: 8.1 10*3/uL (ref 3.6–11.0)

## 2013-08-27 LAB — BASIC METABOLIC PANEL
Anion Gap: 3 — ABNORMAL LOW (ref 7–16)
BUN: 48 mg/dL — AB (ref 7–18)
Calcium, Total: 8.2 mg/dL — ABNORMAL LOW (ref 8.5–10.1)
Chloride: 110 mmol/L — ABNORMAL HIGH (ref 98–107)
Co2: 27 mmol/L (ref 21–32)
Creatinine: 0.95 mg/dL (ref 0.60–1.30)
EGFR (Non-African Amer.): 59 — ABNORMAL LOW
Glucose: 124 mg/dL — ABNORMAL HIGH (ref 65–99)
OSMOLALITY: 293 (ref 275–301)
POTASSIUM: 4.7 mmol/L (ref 3.5–5.1)
Sodium: 140 mmol/L (ref 136–145)

## 2013-08-28 ENCOUNTER — Ambulatory Visit: Payer: Self-pay | Admitting: Internal Medicine

## 2013-08-28 LAB — CBC WITH DIFFERENTIAL/PLATELET
BASOS ABS: 0.1 10*3/uL (ref 0.0–0.1)
Basophil %: 0.7 %
Eosinophil #: 0.1 10*3/uL (ref 0.0–0.7)
Eosinophil %: 0.5 %
HCT: 34.1 % — ABNORMAL LOW (ref 35.0–47.0)
HGB: 10.9 g/dL — ABNORMAL LOW (ref 12.0–16.0)
Lymphocyte #: 0.4 10*3/uL — ABNORMAL LOW (ref 1.0–3.6)
Lymphocyte %: 3.9 %
MCH: 30 pg (ref 26.0–34.0)
MCHC: 32.1 g/dL (ref 32.0–36.0)
MCV: 94 fL (ref 80–100)
MONO ABS: 0.6 x10 3/mm (ref 0.2–0.9)
Monocyte %: 5.2 %
NEUTROS ABS: 9.7 10*3/uL — AB (ref 1.4–6.5)
Neutrophil %: 89.7 %
PLATELETS: 193 10*3/uL (ref 150–440)
RBC: 3.65 10*6/uL — ABNORMAL LOW (ref 3.80–5.20)
RDW: 16 % — ABNORMAL HIGH (ref 11.5–14.5)
WBC: 10.8 10*3/uL (ref 3.6–11.0)

## 2013-08-28 LAB — COMPREHENSIVE METABOLIC PANEL
ALBUMIN: 2.4 g/dL — AB (ref 3.4–5.0)
ALT: 111 U/L — AB (ref 12–78)
Alkaline Phosphatase: 84 U/L
Anion Gap: 3 — ABNORMAL LOW (ref 7–16)
BILIRUBIN TOTAL: 0.3 mg/dL (ref 0.2–1.0)
BUN: 41 mg/dL — AB (ref 7–18)
Calcium, Total: 8.1 mg/dL — ABNORMAL LOW (ref 8.5–10.1)
Chloride: 113 mmol/L — ABNORMAL HIGH (ref 98–107)
Co2: 28 mmol/L (ref 21–32)
Creatinine: 0.86 mg/dL (ref 0.60–1.30)
Glucose: 81 mg/dL (ref 65–99)
Osmolality: 296 (ref 275–301)
Potassium: 4.3 mmol/L (ref 3.5–5.1)
SGOT(AST): 89 U/L — ABNORMAL HIGH (ref 15–37)
SODIUM: 144 mmol/L (ref 136–145)
Total Protein: 6.1 g/dL — ABNORMAL LOW (ref 6.4–8.2)

## 2013-08-29 DIAGNOSIS — I509 Heart failure, unspecified: Secondary | ICD-10-CM

## 2013-08-29 DIAGNOSIS — R069 Unspecified abnormalities of breathing: Secondary | ICD-10-CM

## 2013-08-29 DIAGNOSIS — R0602 Shortness of breath: Secondary | ICD-10-CM

## 2013-08-29 LAB — COMPREHENSIVE METABOLIC PANEL
ANION GAP: 5 — AB (ref 7–16)
AST: 83 U/L — AB (ref 15–37)
Albumin: 2.8 g/dL — ABNORMAL LOW (ref 3.4–5.0)
Alkaline Phosphatase: 98 U/L
BUN: 51 mg/dL — AB (ref 7–18)
Bilirubin,Total: 0.4 mg/dL (ref 0.2–1.0)
CALCIUM: 9 mg/dL (ref 8.5–10.1)
CHLORIDE: 114 mmol/L — AB (ref 98–107)
Co2: 25 mmol/L (ref 21–32)
Creatinine: 1.01 mg/dL (ref 0.60–1.30)
EGFR (Non-African Amer.): 54 — ABNORMAL LOW
GLUCOSE: 146 mg/dL — AB (ref 65–99)
Osmolality: 303 (ref 275–301)
POTASSIUM: 5.2 mmol/L — AB (ref 3.5–5.1)
SGPT (ALT): 162 U/L — ABNORMAL HIGH (ref 12–78)
Sodium: 144 mmol/L (ref 136–145)
TOTAL PROTEIN: 6.9 g/dL (ref 6.4–8.2)

## 2013-09-13 ENCOUNTER — Encounter: Payer: Self-pay | Admitting: Internal Medicine

## 2013-09-28 ENCOUNTER — Ambulatory Visit: Payer: Self-pay | Admitting: Internal Medicine

## 2013-09-28 DEATH — deceased

## 2013-10-13 ENCOUNTER — Telehealth: Payer: Self-pay | Admitting: Internal Medicine

## 2013-10-28 NOTE — Telephone Encounter (Signed)
12/15/2013 lmm @ 432pm for pt to schedule past due defib check with Graciela HusbandsKlein in [place]/mt

## 2013-10-28 DEATH — deceased

## 2013-12-28 NOTE — Telephone Encounter (Signed)
This encounter was created in error - please disregard.

## 2014-06-02 IMAGING — CR DG CHEST 1V PORT
1 series · 1 of 1 positions shown · non-contrast
Comparison: Chest radiograph performed 07/05/2013

CLINICAL DATA: Shortness of breath; difficulty breathing.

EXAM:
PORTABLE CHEST - 1 VIEW

[ap]
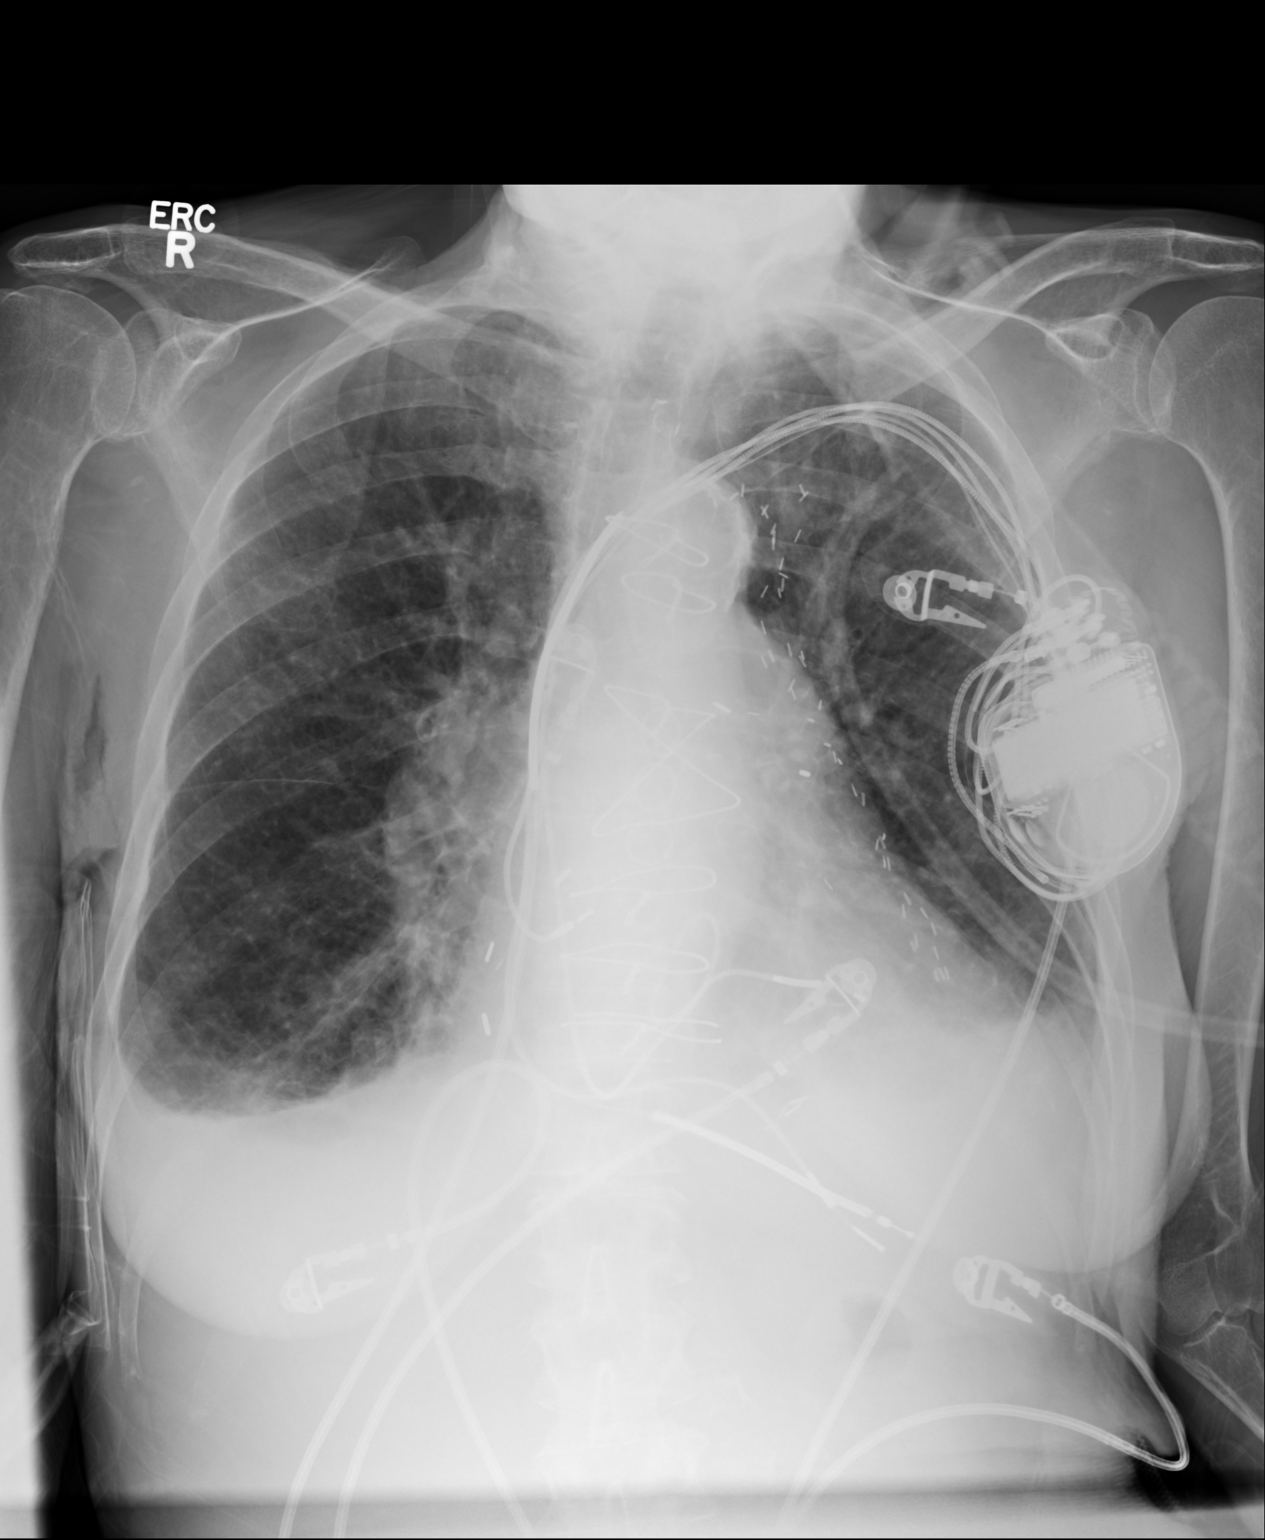

[1 of 1 positions shown; findings below may reference images not displayed]

FINDINGS: The lungs are well-aerated. Increasing small bilateral pleural
effusions are noted, with associated atelectasis. Underlying
vascular congestion is seen. No definite pneumothorax is identified.
Findings could reflect mild interstitial edema.

The cardiomediastinal silhouette is borderline enlarged. The patient
is status post median sternotomy. A pacemaker is seen overlying the
left chest wall, with leads ending overlying the right atrium and
right ventricle. No acute osseous abnormalities are seen.
IMPRESSION: 1. Increasing small bilateral pleural effusions with associated
atelectasis. Findings could reflect mild interstitial edema.
2. Borderline cardiomegaly and underlying vascular congestion.

## 2014-06-08 IMAGING — CR DG CHEST 1V PORT
1 series · 1 of 1 positions shown · non-contrast
Comparison: DG CHEST 1V PORT dated 08/26/2013;

CLINICAL DATA: Respiratory failure

EXAM:
PORTABLE CHEST - 1 VIEW

[ap]
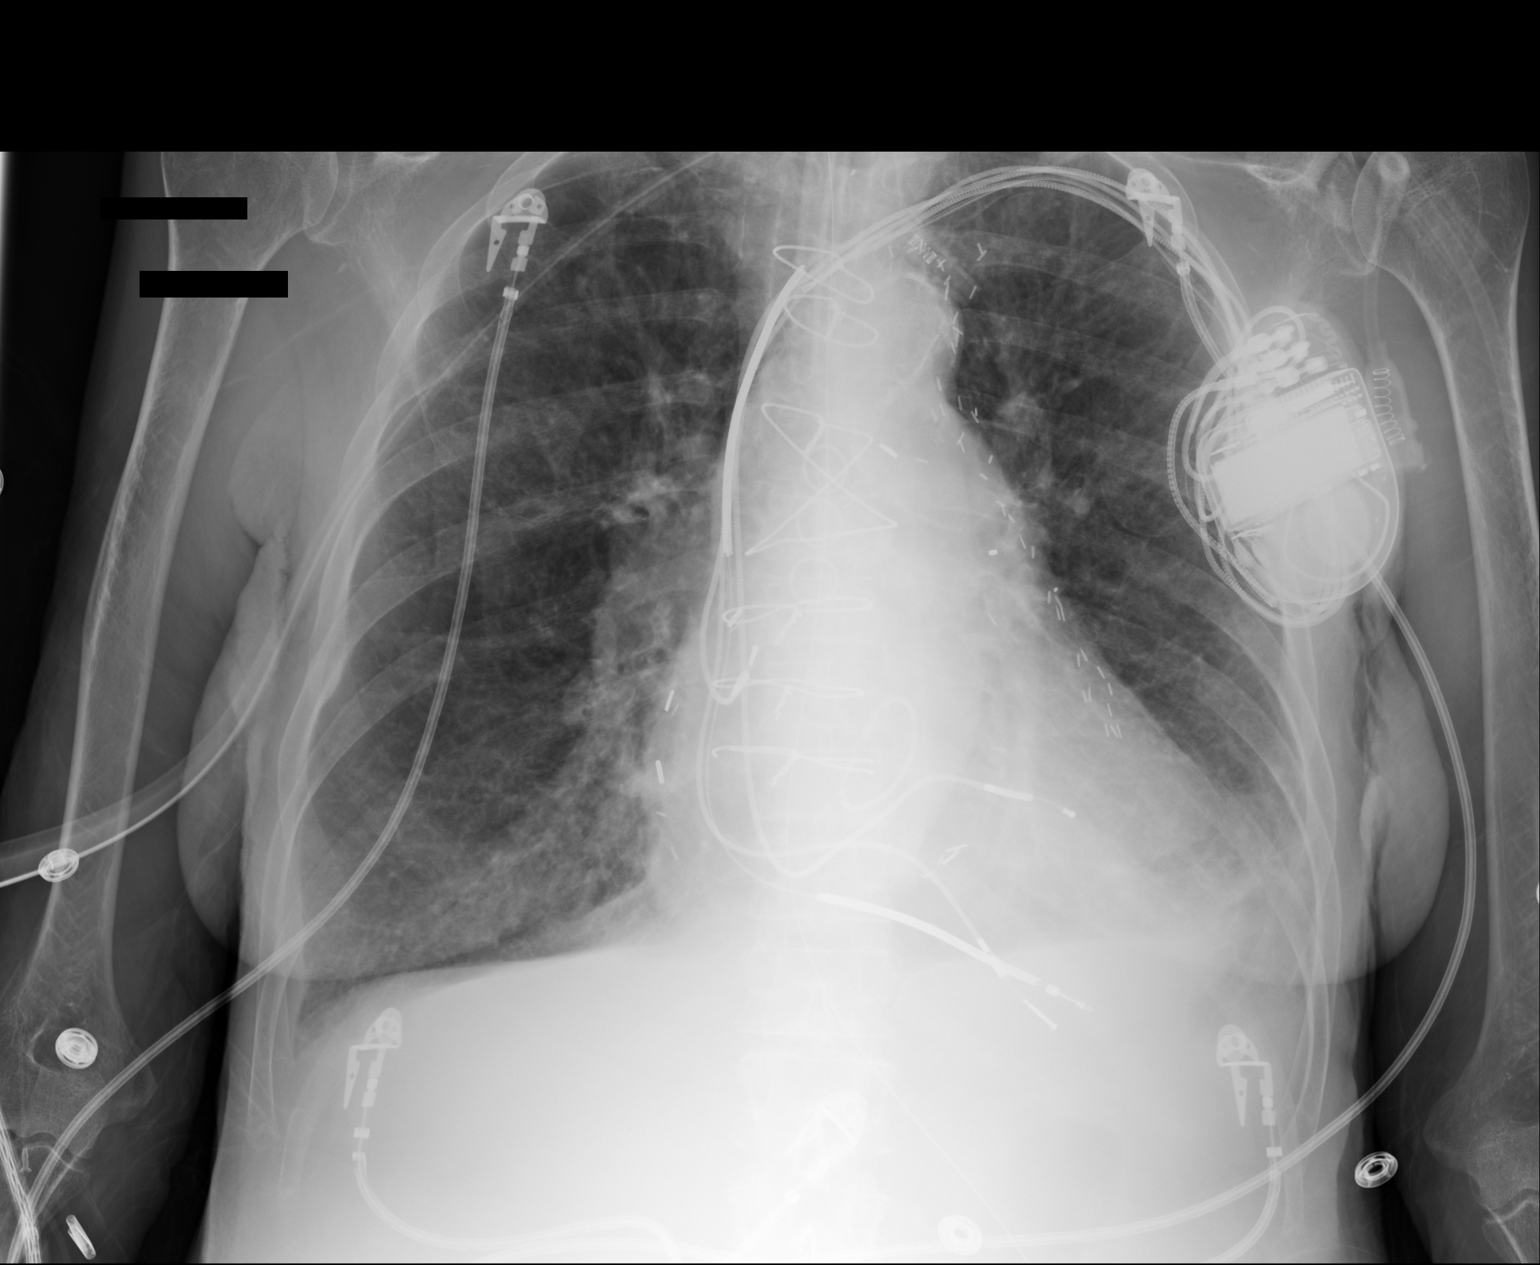

[1 of 1 positions shown; findings below may reference images not displayed]

DG CHEST 2V dated
08/23/2013; DG CHEST 2V dated 07/05/2013; DG CHEST 2V dated 11/25/2010
FINDINGS: Grossly unchanged borderline enlarged cardiac silhouette and
mediastinal contours post median sternotomy, CABG and aortic valve
replacement. Stable position of support apparatus. The lungs are
hyperexpanded No pneumothorax. Chronic trace right and small
left-sided pleural effusions are unchanged. Unchanged bibasilar
heterogeneous opacities, left greater than right. No evidence of
edema. Unchanged bones.
IMPRESSION: 1.  Stable positioning of support apparatus.  No pneumothorax.
2. Hyperexpanded lungs without acute cardiopulmonary disease.
3. Unchanged chronic bilateral effusions and bibasilar opacities,
left greater than right, likely atelectasis/scar.

## 2014-10-17 NOTE — Discharge Summary (Signed)
PATIENT NAME:  [name], [name] MR#:  [id number] DATE OF BIRTH:  [birth year]  DATE OF ADMISSION:  06/09/2012 DATE OF DISCHARGE:  06/12/2012  PRIMARY CARE PHYSICIAN: Fulton MoleHarriett Burns, MD   CARDIOLOGIST: Arnoldo HookerBruce Kowalski, MD   DISCHARGE DIAGNOSES:  1. Acute anemia secondary to left thigh hematoma.  2. Acquired coagulopathy with supratherapeutic INR causing left thigh hematoma. The patient received transfusion of packed red blood cells and fresh frozen plasma and vitamin K. Anemia resolved, and the patient's left thigh hematoma is also decreased.  3. Chronic atrial fibrillation.  4. Ischemic cardiomyopathy with ejection fraction of 35%.  5. History of coronary artery disease, status post CABG.  6. History of complete heart block, status post biventricular implantable cardiac defibrillator and also permanent pacemaker.  7. History of gout.  8. History of chronic obstructive pulmonary disease.  9. Right now the patient has acute renal failure which has resolved. The patient probably has some chronic kidney disease.  10. Chronic hyponatremia.   CONSULTATIONS:  1. Cardiology consult with Dr. Gwen PoundsKowalski.  2. Nephrology consult with Dr. Mady HaagensenMunsoor Lateef.   HOSPITAL COURSE: The patient is a 79 year old female with chronic atrial fibrillation, on Coumadin, came in because of:  1. Left thigh pain, unable to ambulate. The patient's INR was 10 just before she came in. The patient's Coumadin was stopped because of epistaxis. When she came INR was 3.8, but she had a left hematoma and hemoglobin was 6.6; so she was admitted to the intensive care unit and then she was transfused 2 units of packed red blood cells, 2 units of fresh frozen plasma, and vitamin K 5 mg was given. The patient's hemoglobin improved from 6.6 to 10, and INR also dropped from 3.8 to 1.8. The patient had no further evidence of increasing the left thigh hematoma. The hematoma actually is much better than what she was having before, and she is able to  ambulate without any assistance.  The patient is very eager to go home. She was seen by Dr. Gwen PoundsKowalski and Dr. Lady GaryFath. They recommended no anticoagulation at this time because of side effects with hematoma and acute anemia requiring transfusions and fresh frozen plasma. The patient will have outpatient discussion about anticoagulation.  2. Ischemic cardiomyopathy, and history of coronary artery disease and CABG, and pacemaker and defibrillator: She is stable, and she can continue her home medications.  3. Chronic systolic heart failure: Unable to use ACE inhibitor or ARB due to renal insufficiency. That needs to be addressed by the cardiologist as an outpatient.  4. Hyperlipidemia: Continue simvastatin 40 mg daily.  5. Acute renal failure: The patient's BUN and creatinine on admission were BUN 87, creatinine 1.77, and so the patient's Bumex and torsemide were stopped . The patient's Bumex and torsemide were started, and she can continue at home and follow with Dr. Mady HaagensenMunsoor Lateef. The patient's blood pressure today is 105/68, pulse is 107, sats 97 percent on room air. The patient initially received IV fluids due to dehydration and prerenal azotemia, but they were stopped and the patient refused IV fluids because of alarm going off. She was seen by Dr. Mady HaagensenMunsoor Lateef. The patient got an ultrasound of the kidneys showing chronic renal disease and also cyst in the left kidney. The patient also has blood work ordered for ANA and has protein electrophoresis which are pending at this time. The patient insists on going home, so she can go and follow up with him as an outpatient. The patient labs today are sodium  131, potassium 4, chloride 95, bicarbonate 28, BUN 58, creatinine 1.5. Her glucose is 95.0.  Urine protein to creatinine ratio is to 275, so it looks like she has some chronic kidney disease, stage III.  6. Chronic obstructive pulmonary disease: She can continue oxygen, Spiriva, other home medications which are  like Advair.   FOLLOW UP: The patient is advised to stop Coumadin and follow up with Dr. Lady Gary.   TIME SPENT ON DISCHARGE PREPARATION: More than 30 minutes  ____________________________ Katha Hamming, MD [id number] D: 06/12/2012 10:01:22 ET T: 06/12/2012 14:21:03 ET JOB#: [id number]  cc: Katha Hamming, MD, <Dictator> Hyman Hopes, MD Katha Hamming MD ELECTRONICALLY SIGNED 06/15/2012 15:06

## 2014-10-17 NOTE — Consult Note (Signed)
PATIENT NAME:  [name], [name] MR#:  [id number] DATE OF BIRTH:  1936-03-13  DATE OF CONSULTATION:  06/10/2012  REFERRING PHYSICIAN:  Shaune Pollack, MD CONSULTING PHYSICIAN:  Shade Rivenbark Lizabeth Leyden, MD  REASON FOR CONSULTATION: Acute renal failure with possible history of chronic kidney disease.   HISTORY OF PRESENT ILLNESS: The patient is a very pleasant 79 year old Caucasian female with past medical history of coronary artery disease, atrial fibrillation, hypertension, hyperlipidemia, ischemic cardiomyopathy with ejection fraction 25% to 30%, peripheral vascular disease, valvular heart disease, history of complete heart block status post biventricular defibrillator placement, and chronic respiratory failure who presented to [facility] with left hip pain. The patient was found to have a left hip ecchymosis. No definite hematoma was noted on ultrasound. The patient was found to have quite significant anemia with a hemoglobin of 6.6. After transfusion hemoglobin was up to 10. The patient also had evidence of coagulopathy with a INR of 3.8 upon admission. She also appears to have acute renal failure at this time. Upon admission creatinine was 1.77. From 09/05/2010 her creatinine was 0.93. The patient had sustained left hip fracture in [place] in September. At that point in time, she apparently had volume overload. She was seeing a nephrologist at that time. We have requested records from [facility] in [place]; however, we are still awaiting these records. Today the patient's BUN is down to 80 with a creatinine of 1.64. Sodium also is slightly low at 133. The patient denies ingestion of NSAIDs. She was on high-dose torsemide at home.   PAST MEDICAL HISTORY:  1. Coronary artery disease.  2. Atrial fibrillation.  3. Hypertension.  4. Hyperlipidemia.  5. Ischemic cardiomyopathy with ejection fraction 25% to 30%.  6. Peripheral vascular disease.  7. Valvular heart disease.   8. Prior episode of acute renal failure. 9. History of complete heart block status post biventricular defibrillator placement.  10. Chronic respiratory failure.   PAST SURGICAL HISTORY:  1. Hysterectomy.  2. Coronary artery bypass graft.  3. Appendectomy.  4. Mitral valve replacement. 5. Biventricular defibrillator placed in 2006.  6. Peripheral vascular disease with bilateral iliac artery stenosis status post right iliofemoral bypass.   ALLERGIES: NEOSPORIN, PENICILLIN, SULFA, TETANUS, TORADOL   CURRENT INPATIENT MEDICATIONS:  1. Normal saline 0.9 at 50 mL/h.  2. Tylenol 600 mg p.o. every four hours p.r.n. 3. Norco 5/325 mg 1 tablet p.o. q. 4 to 6 hours p.r.n. pain. 4. Allopurinol 100 mg daily. 5. Colace 100 mg p.o. twice a day. 6. Advair 250/50 one puff inhaled twice a day. 7. Morphine 2 to 4 mg IV every four hours p.r.n.  8. Senna 1 tablet p.o. twice a day. 9. Zocor 5 mg p.o. at bedtime.  10. Spiriva 1 capsule inhaled daily.  11. Tramadol 50 mg p.o. every six hours p.r.n.  12. Ambien 5 mg p.o. at bedtime.   SOCIAL HISTORY: The patient lives in [place]. She is widowed. She has history of tobacco abuse, but quit several months ago. Denies alcohol or illicit drug use.   FAMILY HISTORY: The patient's mother died of myocardial infarction. The patient's father had history of alcoholism.   REVIEW OF SYSTEMS: CONSTITUTIONAL: Denies fevers, chills, or weight loss. EYES: Denies diplopia or blurry vision. HEENT: Denies headaches or hearing loss. Denies epistaxis. CARDIOVASCULAR: Denies chest pains or palpitations. Has history of atrial fibrillation and congestive heart failure. RESPIRATORY: Has chronic respiratory failure and has history of chronic obstructive pulmonary disease. GASTROINTESTINAL: Denies nausea, vomiting,  dysgeusia. GENITOURINARY: Denies frequency, urgency, or dysuria. MUSCULOSKELETAL: Reports left hip pain. INTEGUMENTARY: Denies skin rashes. Has left hip ecchymosis.  NEUROLOGIC: Denies focal extremity numbness, weakness, and tingling. PSYCHIATRIC: Denies depression and bipolar disorder. ENDOCRINE: Denies polyuria, polydipsia, or polyphagia. HEMATOLOGIC/LYMPHATIC: Reports easy bruisability. ALLERGY/IMMUNOLOGIC: Denies seasonal allergies or history of immunodeficiency.   PHYSICAL EXAMINATION:   VITAL SIGNS: Temperature 98.6, pulse 101, respirations 20, blood pressure 107/64, pulse oximetry 96% on 2 liters.   GENERAL: Examination reveals a slender, Caucasian female who appears her stated age, currently in no acute distress.   HEENT: Normocephalic, atraumatic. Extraocular movements are intact. Pupils equal, round, and reactive to light. No scleral icterus. Conjunctivae are pink. No epistaxis noted. Gross hearing intact. Oral mucosa moist.   NECK: Supple without JVD or lymphadenopathy.   LUNGS: Clear to auscultation bilaterally with normal respiratory effort.   HEART: S1 and S2 with periods of irregularity noted. 2/6 systolic ejection murmur heard.   ABDOMEN: Soft, nontender, and nondistended. Bowel sounds positive. No rebound or guarding. No gross organomegaly appreciated.   EXTREMITIES: No clubbing, cyanosis, or edema.   NEUROLOGIC: The patient is alert and oriented to time, person, and place. Strength is five out of five in both upper and lower extremities.   SKIN: Warm and dry. Left hip ecchymosis noted.  MUSCULOSKELETAL: There is large ecchymosis overlying the left hip. There is some firmness underneath the skin noted on the left lateral hip   GENITOURINARY: No suprapubic tenderness noted.   PSYCHIATRIC: The patient has an appropriate affect and appears to have good insight into her current illness.   LABORATORY DATA: Sodium 133, potassium 3.2, chloride 96, CO2 29, BUN 80, creatinine 1.64, glucose 92, total protein 5.9, albumin 2.5, total bilirubin 1.2, alkaline phosphatase 204, AST 51, and ALT 50. CBC shows WBC 10.3, hemoglobin 10, hematocrit 28,  and platelets 238. INR down to 1.8   IMPRESSION: This is a 79 year old Caucasian female with past medical history of coronary artery disease, atrial fibrillation, hypertension, hyperlipidemia, ischemic cardiomyopathy, peripheral vascular disease, valvular heart disease, history of recent acute renal failure, chronic complete heart block status post biventricular defibrillator placement, chronic respiratory failure, and chronic hyponatremia who presented to [facility] with left hip ecchymosis.  1. Acute renal failure. Last year the patient's creatinine was noted as being 0.93. Creatinine upon admission was 1.77; however, BUN was quite elevated at 87. Creatinine is down to 1.6 after initiation of hydration. Suspect that she has an element of acute tubular necrosis. However, there is a possibility of underlying chronic kidney disease as the patient was seeing a nephrologist after a hospitalization in [place]. We are currently awaiting records from there. We will obtain SPEP, UPEP, ANA, and renal ultrasound. No indication for dialysis at this time.  2. Hyponatremia. Appears to be chronic. Even in March of 2012 sodium was 131. 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 We will continue this for now. 3. Hypokalemia. Serum potassium is noted as being 3.2. The patient was given potassium chloride 40 mEq p.o. x 1 today. We will check serum magnesium tomorrow as well.   I would like to thank Dr. Imogene Burnhen for this kind referral. Further plan as the patient progresses. ____________________________ Lennox PippinsMunsoor N. Sherlene Rickel, MD mnl:[id number] D: 06/10/2012 15:58:59 ET T: 06/10/2012 16:25:55 ET JOB#: [id number]  cc: Lennox PippinsMunsoor N. Adeoluwa Silvers, MD, <Dictator> Ria CommentMUNSOOR N Lester Platas MD ELECTRONICALLY SIGNED 06/12/2012 20:40

## 2014-10-17 NOTE — H&P (Signed)
PATIENT NAME:  [name], [name] MR#:  [id number] DATE OF BIRTH:  [birth year]  DATE OF ADMISSION:  06/09/2012  ADMITTING PHYSICIAN: Patricia LoftAdaorah E. Patricia Los, DO  PRIMARY CARDIOLOGIST: Patricia HookerBruce Kowalski, MD  ORTHOPEDIC SURGEON: Patricia SaintHoward Miller, MD   CHIEF COMPLAINT: Left hip pain.   HISTORY OF PRESENT ILLNESS: The patient is  a 79 year old female with history of atrial fibrillation, on Coumadin, ischemic cardiomyopathy, last ejection fraction 25 to 30% as per our records, presents with left hip pain that developed nine hours prior to admission. The patient has had a complicated last few months. She was in [place] and had a left hip fracture status post what sounds like open reduction internal fixation. She had a prolonged hospital stay complicated by volume overload and acute renal failure requiring a stay in the rehab. She notes that her entire hospital course was about 46 days long. She recently returned to the area in the middle of November and has been following with Dr. Gwen PoundsKowalski regarding her INR. She started to develop epistaxis, was seen by Dr. Gwen PoundsKowalski three days ago on Monday and was noted to have a supratherapeutic INR of 10, so Coumadin was stopped. On the morning of presentation, she developed left hip pain at about midnight and got up to walk and noted that she could not walk and required a walker to ambulate, which is different for her. She notes that she has been ambulating independently most recently. Due to the severe pain, she presented to the Emergency Department. Upon evaluation in the ED, a large left thigh hematoma extending from the anterior aspect of the thigh medially as well as posteriorly was found on exam. The patient was unaware of the hematoma and only noticed it when she got to the Emergency Department. She denies it existing throughout the day yesterday. Initial blood work shows a hemoglobin of 6.6 as well as an INR of 3.8, so we are asked to admit. Of note, her creatinine is 1.77 during  this evaluation; however, given that she developed some renal insufficiency from her hospitalization in September at [place], it is unclear if this is acute or worsened. Of note, while she was in [place] she was [facility].  Her nephrologist there was Dr. Jarome LamasMancini, phone number [phone].  FAX number is [phone].  PAST MEDICAL HISTORY:  1. Coronary artery disease.  2. Atrial fibrillation.  3. Hypertension.  4. Hyperlipidemia.  5. Ischemic cardiomyopathy, ejection fraction 25 to 30%, with a history of congestive heart failure.  6. Peripheral vascular disease.  7. Valvular disease.  8. Recent history of chronic kidney disease.  9. Recent history of volume overload due to renal failure, not on dialysis.  10. Complete heart block and cardiomyopathy, status post biventricular defibrillator placement. 11. Chronic respiratory failure, on supplemental oxygen.  PAST SURGICAL HISTORY:  1. Hysterectomy.  2. Cardiac bypass.  3. Appendectomy.  4. Mitral valve replacement.  5. Defibrillator.   6. Biventricular defibrillator placed in 2006. 7. Peripheral vascular disease with bilateral iliac stenosis, status post right iliofemoral bypass.  MEDICATIONS:  1. Advair Diskus 250/50 mcg inhaled twice a day.  2. Alendronate 70 mg, one tablet weekly.  3. Allopurinol 100 mg daily.  4. Ambien 5 mg, one tablet daily as needed for insomnia.  5. Bumex 2 mg twice a day.  6. Colchicine 0.6 mg daily.  7. Coumadin 5 mg daily.  8. Demeclocycline 300 mg, one tablet twice a day. 9. Norco 5/325 mg, 1 to  2 tablets every four hours as needed for pain.  10. Simvastatin 40 mg daily at bedtime.  11. Spiriva 18 mcg inhaled daily.  12. Torsemide 20 mg twice a day.  13. Tramadol 50 mg four  times a day.   ALLERGIES: Neosporin, penicillin, sulfa drugs, tetanus toxoid, Toradol, metal.  Please note that I asked the patient about prior use of Lasix, and informs me that she has had no  reactions to Lasix.  SOCIAL HISTORY: She quit tobacco use during her last hospitalization in September. Prior to that she smoked a pack a day for many years. She denies alcohol or illicit drug use. She lives with her son and grandson.   FAMILY HISTORY: Notable for nephritis, heart problems and stroke.   REVIEW OF SYSTEMS: CONSTITUTIONAL: Denies fevers, weight loss, fatigue. EYES: Denies blurred vision. Denies double vision. ENT: Denies tinnitus, ear pain or hearing changes. Admits epistaxis. RESPIRATORY: Notes that she has a chronic mild cough. Denies wheezing, hemoptysis, or chest pain. CARDIOVASCULAR: No chest pain, orthopnea, or edema. GASTROINTESTINAL: Denies nausea, vomiting, diarrhea, abdominal pain, melena or hematochezia. GENITOURINARY: Denies dysuria, frequency, or hematuria. HEMATOLOGY: Denies prior bruising or anemia. SKIN: Denies any prior rash. MUSCULOSKELETAL: Notes arthritis. No joint swelling. NEUROLOGIC: No prior CVAs. No tremors. No headaches. No paresthesias. PSYCHIATRIC: No history of anxiety or depression.   PHYSICAL EXAMINATION:  VITAL SIGNS: Temperature 97.8, heart rate 63, respirations 18, blood pressure 110/47.   GENERAL: Elderly Caucasian female in no apparent distress. She is pale.   SKIN: There is distinct pallor. There is a large hematoma on the anterior medial thigh that wraps around posteriorly on the left side. This is nontender. No other rashes or bruising noted.   PSYCHIATRIC: The patient is awake, alert, and oriented x3. History is obtained from her.   HEENT: Eyes: Pupils are equally round and reactive to light and accommodation. Anicteric sclerae. There is conjunctival pallor.   CARDIAC: S1, S2, regular rate and rhythm. No pretibial edema.   PULMONARY: Clear to auscultation bilaterally. No wheezes, rales, or rhonchi. Normal effort.   MUSCULOSKELETAL: Full range of motion of all extremities although there is some limitation in left hip due to discomfort.    ABDOMEN: Soft, nontender, nondistended. No hepatosplenomegaly noted. Normal bowel sounds.   LABORATORY, DIAGNOSTIC AND RADIOLOGICAL DATA: Glucose 119, BUN 87, creatinine 1.77, sodium 133, potassium 4.5, chloride 95, bicarbonate 28, calcium 7.8, bilirubin 1.2, alkaline phosphatase 204, ALT 50, AST 51, total protein 5.9, albumin 2.5, osmolality 294. GFR 28, anion gap 10. INR is 3.8. White count is 11.6, hemoglobin is 6.6, hematocrit is 20.5, platelets are 305, MCV is 95. Ultrasound of the left lower extremity is pending.   ASSESSMENT AND PLAN: A 79 year old female presenting with a large ecchymosis of the left thigh. Concern for hematoma in the setting of acute anemia and Coumadin coagulopathy.   1. Left thigh ecchymosis concerning for hematoma: We will follow up the results of the ultrasound. In the interim, we will be aggressive in reversing her coagulopathy with FFP and vitamin K. We will repeat an INR after FFP is completed.  2. Acute blood loss anemia: Hemoglobin 13, last year; though her new baseline is unclear after her recent hospitalization, hemoglobin of 6.6 is too low in a patient with a history of coronary artery disease. We will transfuse 3 units packed red blood cells with Lasix in between doses to avoid volume overload. We will follow up CBC four hours after transfusion.  3. Coumadin coagulopathy with active  bleeding: Reverse with FFP and vitamin K and reassess INR.  4. Chronic kidney disease, stage IV: Unclear baseline given most recent hospitalization in [place]. We will obtain records from the nephrologist in [place], [place], Dr. Jarome Lamas, telephone number [phone]. FAX number [phone]. Also, we will request records from [facility].  5. Atrial fibrillation, is rate controlled: We will hold anticoagulation. I will consult Dr. Gwen Pounds for assistance regarding further anticoagulation of this patient who seems to have increased propensity for bleeding.   6. Hypertension: Well controlled.  7. Ischemic cardiomyopathy: Monitor closely for volume overload given transfusions.  8. Prophylaxis: Pneumatic compression boots. Hold anticoagulants given acute bleeding.  9. Disposition: The patient is being admitted to the Intensive Care Unit for management of severe anemia with concern for left thigh hematoma in the setting of a supratherapeutic INR.   CODE STATUS:  FULL CODE.  Surrogate decision maker is her son, Jamyah Folk.    TIME SPENT:   Critical care time coordinating admission, including discussing case with the family, coordinating the patient's care is 60 minutes. ____________________________ Patricia Loft, DO aeo:cbb D: 06/09/2012 07:33:41 ET T: 06/09/2012 08:31:08 ET JOB#: [id number]  cc: Patricia Loft, DO, <Dictator> Kaytee Taliercio E Tiago Humphrey DO ELECTRONICALLY SIGNED 06/10/2012 0:48

## 2014-10-17 NOTE — Discharge Summary (Signed)
PATIENT NAME:  [name], [name] MR#:  [id number] DATE OF BIRTH:  09-14-1935  DATE OF ADMISSION:  06/09/2012 DATE OF DISCHARGE:  06/12/2012  ADDENDUM:   HOSPITAL COURSE: The patient was  seen by the physical therapist, recommended home with home health. The patient follows with [facility],  so she can resume the home health and home oxygen thru [facility].    ____________________________ Katha HammingSnehalatha Jermeka Schlotterbeck, MD sk:cbb D: 06/12/2012 10:02:00 ET T: 06/12/2012 14:37:27 ET JOB#: [id number]  cc: Katha HammingSnehalatha Dara Beidleman, MD, <Dictator> Hyman HopesHarriett P. Burns, MD Katha HammingSNEHALATHA Albie Bazin MD ELECTRONICALLY SIGNED 06/15/2012 15:06

## 2014-10-17 NOTE — Consult Note (Signed)
General Aspect Pt is a 79 yo female with history of cad s./p cabg in 1998 with a lima to the lad, svg to diagonal, obtuse marginal and distal rca and pda. Has ischemic cardiomyopathy with an ef of 30%, histoyr of mitral valve repair with a #28 Carpentier-Edwards ring annuloplasty done at time of cabg. She has chronic afib and history of chb with an biventricular aicd/pacemaker in place. She is now admitted after developing severe pain and a large hematoma in her hip. She is chronically anticoagulated with warfarin with therapeutic inr levels. Due to the hematoma, she has been taken off of her warfarin and reversed with ffp and Vit K. SHe is also receiving prbcs. Consult called to determine whether she should resume antiocagulation in the future. Her CHADSS score is 3.   Physical Exam:   GEN no acute distress, thin    HEENT PERRL, hearing intact to voice    NECK supple    RESP normal resp effort  clear BS  no use of accessory muscles    CARD Regular rate and rhythm  Murmur    Murmur Systolic    Systolic Murmur axilla    ABD denies tenderness  normal BS    LYMPH negative neck, negative axillae    EXTR negative cyanosis/clubbing, hematoma on left thigh    SKIN normal to palpation    NEURO cranial nerves intact    PSYCH A+O to time, place, person   Review of Systems:   Subjective/Chief Complaint left hip/thigh pain    General: Fatigue    Skin: No Complaints    ENT: No Complaints    Eyes: No Complaints    Neck: No Complaints    Respiratory: No Complaints    Cardiovascular: No Complaints    Gastrointestinal: No Complaints    Genitourinary: No Complaints    Vascular: No Complaints    Musculoskeletal: hip pain    Neurologic: No Complaints    Hematologic: Ease of bruising  Ease of bleeding    Endocrine: No Complaints    Psychiatric: No Complaints    Review of Systems: All other systems were reviewed and found to be negative    Medications/Allergies Reviewed  Medications/Allergies reviewed     Hypercholesterolemia:    a fib:    chf:    pvd:    stent /strain to right femerol artery r/t pvd hx:    cabg bypass x5:    Appendectomy:    mitral valve replacement:    Hysterectomy - Total:    gold pacer / diffibulator:   Home Medications: Medication Instructions Status  simvastatin 40 mg oral tablet 1  orally once a day (at bedtime)  Active  Coumadin 5 mg oral tablet 1  orally once a day  Active  Norco 5 mg-325 mg oral tablet 1-2 tabs orally 4-6 hours as needed for pain Active  Ambien 5 mg oral tablet 1  orally once a day (at bedtime) as needed for insomnia   Active  demeclocycline 300 mg oral tablet 1 tab(s) orally 2 times a day Active  Colcrys 0.6 mg oral tablet  orally once a day Active  traMADOL 50 mg oral tablet, disintegrating 1 tab(s) orally 4 times a day Active  allopurinol 100 mg oral tablet tab(s) orally once a day Active  bumetanide 2 mg oral tablet tab(s) orally 2 times a day Active  torsemide 20 mg oral tablet tab(s) orally 2 times a day Active  alendronate 70 mg oral tablet 1  tab(s) orally once a week Active  Spiriva 18 mcg inhalation capsule 1 each inhaled once a day Active  Advair Diskus 250 mcg-50 mcg inhalation powder 1 puff(s) inhaled 2 times a day Active   EKG:   Interpretation afib with v paced rhythm    Toradol: Unknown  Sulfa drugs: Unknown  Neosporin: Unknown  Tetanus Toxoid: Unknown  Other -Explain in Comment Field: Unknown  PCN: Unknown    Impression 79 yo female with histoyr of ischemic cardiomyopathy with ef of 30%, history  of afib with history of left hip fracture now admitted with hematoma on left thigh requiring transfusion. She is anticoagulated with warfarin and had therapeutic inr. She has been reversed and is recieving ffp and prbcx for a hgb of 6.6. She has afib with chb with biv aicd in place which is functioning normally. She will need to come off of warfarin for now. Long term  anticoaulation will depend on her course and comorbid events, but for now, would remian off of coumadin during hospitalizaiton and at discharge. Further anticoagulation decisions would be made as outpatient per her primary cardiologist    Plan 1. Agree with holding warfarin for now. 2. Transfuse as you are doing. 3. AICD/pacemaker is funcitoning normally 4. Continue other meds 5. Further recs pending course   Electronic Signatures: Dalia Heading (MD)  (Signed 11-Dec-13 17:09)  Authored: General Aspect/Present Illness, History and Physical Exam, Review of System, Past Medical History, Home Medications, EKG , Allergies, Impression/Plan   Last Updated: 11-Dec-13 17:09 by Dalia Heading (MD)

## 2014-10-21 NOTE — Consult Note (Signed)
   General Aspect Patient resting quietly in CCU bed, in no acute distress.   Present Illness This is a 79 year old white female with known ischemic cardiomyopathy with EF 25%, status post AICD placement, known  CAD, status post CABGs surgery, who was recently seen in our office and had an increase in her dosage of diuretic.  She states that initially, the increased diuretic was helpful, but then she began to develop worsening dyspnea as well as lower extremity edema and orthopnea, at which time she presented to the emergency department.  She denies any episodes of chest pain.  EKG on admission showed paced rhythm with nonspecific T-wave changes in the inferior leads.  Troponin levels have been mildly elevated, and likely represents a demand ischemia.  She has been treated appropriately with increased diuresis, supplemental oxygen, beta blockers, and angiotensin receptor blocker, with significant improvement in her symptoms.   Physical Exam:  GEN no acute distress   HEENT PERRL   NECK supple  No masses  trachea midline   RESP normal resp effort  rhonchi   CARD Regular rate and rhythm  Normal, S1, S2   ABD denies tenderness  soft  normal BS   EXTR positive edema, trace bilateral lower extremity edema   NEURO cranial nerves intact   PSYCH alert, A+O to time, place, person   Review of Systems:  Subjective/Chief Complaint shortness of breath   Respiratory: Short of breath   Cardiovascular: Dyspnea  Orthopnea  Edema   Review of Systems: All other systems were reviewed and found to be negative   EKG:  Interpretation paced rhythm with nonspecific T-wave changes in the inferior leads    Toradol: Unknown  Sulfa drugs: Unknown  Neosporin: Unknown  Tetanus Toxoid: Unknown  Other -Explain in Comment Field: Unknown  PCN: Unknown   Impression 79 year old female with known ischemic cardiomyopathy with EF 25%, status post AICD placement, known CAD with prior CABGs surgery and MVR, known  peripheral vascular disease with stenting to the right lower extremity, COPD on chronic O2 use, with acute on chronic systolic CHF and mild troponin elevation, likely representing  demand ischemia.   Plan Agree with echocardiogram to assess LV function as well as valvular heart disease. Continue aggressive diuresis with close monitoring of renal function. Continue beta blocker and ARB for treatment of cardiomyopathy, with further consideration of use of Aldactone in the near future, if tolerated.   Electronic Signatures: Olin PiaMelville, Aayana Reinertsen J (NP)  (Signed 23-Feb-15 14:44)  Authored: General Aspect/Present Illness, History and Physical Exam, Review of System, EKG , Allergies, Impression/Plan   Last Updated: 23-Feb-15 14:44 by Olin PiaMelville, Joanathan Affeldt J (NP)

## 2014-10-21 NOTE — Discharge Summary (Signed)
PATIENT NAME:  [name], [name] MR#:  [id number] DATE OF BIRTH:  March 09, 1936  DATE OF ADMISSION:  08/22/2013 DATE OF DISCHARGE:  09/01/2013  TYPE OF DISCHARGE: The patient transferred to Hospice Home.   REASON FOR ADMISSION: Shortness of breath.   HISTORY OF PRESENT ILLNESS: Please see the dictated HPI done by Dr. Heron NayVasireddy on 08/22/2013.   PAST MEDICAL HISTORY:  1. Chronic systolic congestive heart failure.  2. Chronic respiratory failure, on oxygen.  3. COPD.  4. Chronic atrial fibrillation.  5. Peripheral vascular disease.  6. ASCVD status post CABG.  7. Hyperlipidemia.  8. Status post mitral valve replacement.  9. Gout.  10. Status post appendectomy.  11. Status post hysterectomy.  12. Status post defibrillator and pacemaker placement.  13. Ischemic cardiomyopathy.   MEDICATIONS ON ADMISSION: Please see admission note.   ALLERGIES: PENICILLIN, TORADOL, SULFA, NEOSPORIN AND TETANUS TOXOID.   SOCIAL HISTORY, FAMILY HISTORY AND REVIEW OF SYSTEMS: As per admission note.   PHYSICAL EXAMINATION:   GENERAL: The patient was in no acute distress.  VITAL SIGNS: Stable with a saturation of 98% on BiPAP.  HEENT: Unremarkable.  NECK: Supple without JVD.  LUNGS: Revealed decreased breath sounds with prolonged expiratory phase.  CARDIAC: Revealed a regular rate and rhythm with a normal S1, S2.  ABDOMEN: Soft and nontender.  EXTREMITIES: Reveal 1+ edema.   HOSPITAL COURSE: The patient was admitted with acute hypoxic hypercarbic respiratory failure requiring BiPAP. She was also noted to have acute on chronic systolic congestive heart failure with an elevated troponin. COPD exacerbation as well. She was admitted to the Intensive Care Unit. In the Intensive Care Unit, the patient's respiratory status worsened, and she required intubation. She was seen in consultation by pulmonology and cardiology. She was weaned off of the ventilator. She was seen by palliative care and made comfort care. She was  transferred out to the floor, where she stabilized with comfort measures only. The family requested Hospice Home. She was transferred to the Hospice Home on 09/01/2013 for further care and comfort.   DISCHARGE DIAGNOSES:  1. Acute hypoxic hypercarbic respiratory failure.  2. Acute on chronic systolic congestive heart failure.  3. Ischemic cardiomyopathy. 4. Chronic obstructive pulmonary disease with chronic respiratory failure.  5. Demand ischemia.  6. History of atrial fibrillation.   FOLLOWUP PLANS AND APPOINTMENTS: The patient is comfort care only with morphine as needed at hospice home. She will be followed by the hospice physician there.   ____________________________ Duane LopeJeffrey D. Judithann SheenSparks, MD jds:lb D: 09/17/2013 10:22:07 ET T: 09/17/2013 10:47:35 ET JOB#: [id number]  cc: Duane LopeJeffrey D. Judithann SheenSparks, MD, <Dictator> JEFFREY Rodena Medin SPARKS MD ELECTRONICALLY SIGNED 09/17/2013 11:38

## 2014-10-21 NOTE — Consult Note (Signed)
   Comments   I met with pt's son. He agrees patient is doing better but says he knows she is very high risk for decline and still wants her to remain comfortable. We talked about her disposition including options for rehab vs Hospice Home vs home with hospice. Son does not feel patient can participate nor would she benefit in rehab. I agree. Son ultimately would prefer the Hospice Home and understands it may be for end of life care. Together, we then spoke with patient. She seemed frightened at the word "hospice" but agreed with this plan.  Home  Electronic Signatures for Addendum Section:  Phifer, Izora Gala (MD) (Signed Addendum 04-Mar-15 17:30)  Discussed with Billey Chang, NP, in detail. Agree with assessment and plan as outlined in above note.   Electronic Signatures: Octavius Shin, Kirt Boys (NP)  (Signed 04-Mar-15 12:35)  Authored: Palliative Care   Last Updated: 04-Mar-15 17:30 by Phifer, Izora Gala (MD)

## 2014-10-21 NOTE — H&P (Signed)
PATIENT NAME:  [name], [name] MR#:  [id number] DATE OF BIRTH:  April 20, 1936  DATE OF ADMISSION:  07/04/2013  REFERRING PHYSICIAN:  Dr. Dolores Frame PRIMARY CARE PHYSICIAN: Dr. Aram Beecham.   CHIEF COMPLAINT: Shortness of breath.   HISTORY OF PRESENT ILLNESS: A 79 year old Caucasian female with past medical history of chronic obstructive pulmonary disease who is O2 dependent with 2 liters nasal cannula at night at baseline, as well as coronary artery disease status post CABG with ischemic cardiomyopathy ejection fraction 25%, presenting with shortness of breath. She describes a one week duration of dyspnea on exertion, which gradually worsened over the last two days. She is now states her dyspnea on exertion is exacerbated by walking room to room. She had an associated cough with productive yellow sputum for 2 to 3 days. She denies any fevers, chills, or chest pain. She states that she did note some bilateral foot edema and increased her Lasix to 40 mg b.i.d. for two days, as opposed to regular dose of 20 mg.  She denies any recent sick contacts or fevers. Currently without complaints. She received one breathing treatment and thus far in the Emergency Department.   REVIEW OF SYSTEMS:    CONSTITUTIONAL: Denies fever, fatigue, weakness.  EYES: Denies blurred vision, double vision, eye pain.  EARS, NOSE, THROAT: Denies tinnitus, ear pain, hearing loss.  RESPIRATORY: Positive for productive cough as described above. Occasional wheeze, however, denies hemoptysis.  CARDIOVASCULAR: Denies chest pain, palpitations. Positive for edema. She denies any orthopnea or PND.  GASTROINTESTINAL: Denies nausea, vomiting, diarrhea, abdominal pain.  GENITOURINARY: Denies dysuria, hematuria.  ENDOCRINE: Denies, nocturia, thyroid problems.  HEMATOLOGIC/LYMPHATIC:  Denies easy bruising or bleeding.  SKIN: Denies rash or lesion.  MUSCULOSKELETAL: Denies pain in neck, back, shoulder, knees, hips, arthritic symptoms.  NEUROLOGIC:  Denies paralysis, paresthesias.   PSYCHIATRIC: Denies anxiety or depressive symptoms.  Otherwise, full review of systems performed by me is negative.   PAST MEDICAL HISTORY: Significant for chronic obstructive pulmonary disease, O2 dependent with 2 liters nasal cannula at nighttime at baseline, coronary artery disease status post CABG, ischemic cardiomyopathy ejection fraction 25%, status post permanent pacemaker and automatic implantable cardiac defibrillator placement, history of atrial fibrillation now V-paced,  hyperlipidemia, mitral valve replacement as well as gout.   FAMILY HISTORY: Positive for coronary artery disease in multiple family members as well as hypertension.   SOCIAL HISTORY: Remote history of tobacco use. She denies any alcohol usage.   ALLERGIES: NEOSPORIN, PENICILLIN, SULFA DRUGS, TETANUS TOXOID AS WELL AS TORADOL.   HOME MEDICATIONS: Per our last documentation include Advair 250/50 mcg inhalation b.i.d., alendronate 70 mg p.o. daily, allopurinol 100 mg p.o. daily Ambien 5 mg p.o. at bedtime as needed for insomnia, Bumex 2 mg p.o. b.i.d., Colcrys 0.6 mg p.o. daily, meclocycline, Norco 5/325 mg 1 tablet p.o. q. 4-6 hours as needed pain, simvastatin 40 mg p.o. at bedtime, Spiriva 18 mcg inhalation daily, torsemide 20 mg p.o. b.i.d., tramadol 50 mg p.o. 4 times daily as needed for pain.   PHYSICAL EXAMINATION: VITAL SIGNS: Temperature 98.2, heart rate 136, respirations 24, blood pressure 100/57, saturating 100% on 4 liters nasal cannula.  GENERAL: Chronically ill, frail-appearing Caucasian female, currently in minimal distress secondary to respiratory status.  HEAD: Normocephalic, atraumatic.  EYES: Pupils equal, round and reactive light, extraocular muscles intact. No scleral icterus.  MOUTH: Moist mucosal membranes. Dentition intact. No abscess noted.  EAR, NOSE, THROAT: Throat clear without exudates, no external lesions.  NECK: Supple. No thyromegaly. No nodules.  No JVD.   PULMONARY: Prolonged expiratory phase with scant wheeze most prominent in the left upper lobe. No use of accessory muscles. Good respiratory effort.   CHEST: Nontender to palpation. S1, S2, tachycardic. No edema. Pedal pulses 2+ bilaterally.  GASTROINTESTINAL: Soft, nontender, nondistended. No masses. Positive bowel sounds. No hepatosplenomegaly.  MUSCULOSKELETAL: No swelling, clubbing or edema. Range of motion full in all extremities.  NEUROLOGIC: Cranial nerves II through XII intact. No gross focal deficits. Sensation and reflexes intact.  SKIN: No ulcerations, lesions, rash or cyanosis. Skin warm, dry. Turgor is intact.  PSYCHIATRIC: Mood and affect within normal limits. The patient is awake, alert, oriented x 3. Insight and judgment intact.  Weight 43.1 kg, BMI 19.2.   LABORATORY, DIAGNOSTIC AND RADIOLOGIC DATA: Sodium 133, potassium 4, chloride 97, bicarbonate 32, BUN 38, creatinine 0.87, glucose 118. LFTs: Albumin 3.3, alkaline phosphatase 138, AST 40; remainder within normal limits. CK-MB 4.1. WBC 9.8, hemoglobin 11.4, platelets 146. INR 1.1. ABG performed, 7.39/57/101/34.5 on 4 liters nasal cannula.   Chest x-ray revealing flattened diaphragms, hyperinflated lung fields as well as sternotomy wires and automatic implantable cardiac defibrillator and pacemaker placed with evidence of CABG, but in no acute cardiopulmonary process.   EKG performed, heart V-paced.   ASSESSMENT AND PLAN: A 79 year old Caucasian female, history of chronic obstructive pulmonary disease as well as coronary disease, ischemic cardiomyopathy, presented for shortness of breath.  1.  Chronic obstructive pulmonary disease exacerbation. Provide supplemental O2 to keep oxygen saturation greater than 92%. Provide DuoNeb therapy every 4 hours, steroids with Solu-Medrol 60 mg IV daily. Add incentive spirometry. Provide azithromycin, given the yellow sputum production, continue home doses of Advair and Spiriva.  2.  Ischemic  cardiomyopathy with ejection fraction 25%. No current evidence of congestive heart failure exacerbation. Continue home dosage of torsemide as well as Bumex.  3.  Coronary artery disease, status post coronary artery bypass graft. Continue home medications including her statin therapy.  4.  Deep venous thrombosis prophylaxis with heparin subcutaneous.   CODE STATUS:  The patient is full code.   TIME SPENT: 45 minutes    ____________________________ Cletis Athensavid K. Bhavik Cabiness, MD dkh:cc D: 07/04/2013 00:56:08 ET T: 07/04/2013 01:58:51 ET JOB#: [id number]  cc: Cletis Athensavid K. Davaris Youtsey, MD, <Dictator> Jarelyn Bambach Synetta ShadowK Capria Cartaya MD ELECTRONICALLY SIGNED 07/05/2013 0:37

## 2014-10-21 NOTE — H&P (Signed)
PATIENT NAME:  [name], [name] MR#:  [id number] DATE OF BIRTH:  10/18/1935  DATE OF ADMISSION:  08/21/2013  REFERRING PHYSICIAN: Dr. Minna Antis.   PRIMARY CARE PHYSICIAN: Dr. Aram Beecham.   CHIEF COMPLAINT: Shortness of breath.   HISTORY OF PRESENT ILLNESS: [name] is a 79 year old female with history of COPD, ischemic cardiomyopathy with EF of 25%, with systolic congestive heart failure, status post defibrillator and pacemaker placement. Presented to the Emergency Department with complaints of shortness of breath for the last 2 to 3 days. The patient started to have a cough with mild productive sputum. Did not have any fever. The shortness of breath has been getting gradually worse. This was associated with PND and orthopnea. The patient states has been compliant with salt and fluid intake. The patient took increased oral Lasix; however, as was gradually getting worse, called EMS and was brought to the Emergency Department. The patient was initially tachypneic, tachycardic. Initially, ABG was 7.29 with a pCO2 of 64. The patient was placed on BiPAP. The patient was given 1 dose of Lasix. Initial set of troponin was 0.10 with a BNP of 28,000.   PAST MEDICAL HISTORY:  1. Atrial fibrillation.  2. Congestive heart failure, systolic, chronic.  3. Peripheral vascular disease.  4. Coronary artery disease, status post coronary artery bypass grafting.  5. Peripheral vascular disease, status post right femoral artery stent placement.  6. COPD, oxygen dependent on 2 liters of oxygen.  7. Hyperlipidemia.  8. Mitral valve replacement.  9. Gout.   PAST SURGICAL HISTORY:  1. Left hip replacement.  2. Appendectomy.  3. Mitral valve replacement.  4. Hysterectomy.  5. Defibrillator and pacemaker placement.   ALLERGIES:  1. PENICILLIN.  2. TORADOL.  3. SULFA.  4. NEOSPORIN.  5. TETANUS TOXOID.   HOME MEDICATIONS:  1. Torsemide 20 mg 2 times a day.  2. Simvastatin 40 mg once a day.  3.  Losartan 25 mg once a day.  4. Klor-Con 20 mEq once a day.  5. Colcrys 0.6 mg once a day.  6. Coreg 6.25 mg 2 times a day.  7. Allopurinol 100 mg once a day.  8. Advair Diskus 1 puff 2 times a day.   SOCIAL HISTORY: Former smoker. Denies drinking alcohol or using illicit drugs.   FAMILY HISTORY: Positive for coronary artery disease in multiple family members and hypertension.   REVIEW OF SYSTEMS:  CONSTITUTIONAL: Generalized weakness.  EYES: No change in vision.  ENT: No change in hearing.  RESPIRATORY: Has cough, shortness of breath.  CARDIOVASCULAR: No chest pain, palpitations.  GASTROINTESTINAL: No nausea, vomiting, abdominal pain.  GENITOURINARY: No dysuria or hematuria.  ENDOCRINE: No polyuria or polydipsia.  HEMATOLOGIC: No easy bruising or bleeding.  SKIN: No rash or lesions.  MUSCULOSKELETAL: Has osteoarthritis.  NEUROLOGIC: numbness in any part of the body.   PHYSICAL EXAMINATION:  GENERAL: This is a thin-built female lying down in the bed, not in distress.  VITAL SIGNS: Temperature 98, pulse 104, blood pressure 119/60, respiratory rate of 34, oxygen saturation is 98% on BiPAP.  HEENT: Head normocephalic, atraumatic. There is no scleral icterus. Conjunctivae normal. Pupils equal and react to light. Extraocular movements are intact. Mucous membranes moist. No pharyngeal erythema.  NECK: Supple. No lymphadenopathy. No JVD. No carotid bruit. No thyromegaly.  CHEST: Has no focal tenderness. Somewhat decreased breath sounds. Prolonged expiratory phase.  HEART: S1, S2 regular. No murmurs are heard.  ABDOMEN: Bowel sounds are present. Soft, nontender, nondistended. No hepatosplenomegaly.  EXTREMITIES: Pitting edema 1+.  NEUROLOGIC: The patient is alert, oriented to place, person and time. Cranial nerves II through XII intact. Motor 5/5 in upper and lower extremities.  SKIN: No rash or lesions.  MUSCULOSKELETAL: Good range of motion in all of the extremities.   LABORATORIES:  ABG: PH of 7.27, pCO2 of 64, pO2 of 298.   Chest x-ray, 1 view portable: Increasing small bilateral pleural effusion associated with atelectasis. Borderline cardiomegaly and interstitial edema.   Troponin 0.10. BNP 28,000.   CMP: BUN 43, creatinine of 1.29. Sodium was 132. The rest of all of the values are within normal limits.   CBC is completely within normal limits.   EKG, 12-lead, shows atrial fibrillation.   ASSESSMENT AND PLAN: [name] is a 79 year old female with known history of congestive heart failure, chronic obstructive pulmonary disease. Comes to the Emergency Department with shortness of breath.  1. Acute hypoxic hypercarbic respiratory failure: Continue with BiPAP. Repeat the arterial blood gas in the morning. Continue with the diuresis. This is a combination of congestive heart failure and chronic obstructive pulmonary disease exacerbation.  2. Congestive heart failure, systolic, acute on chronic: Continue with Lasix. The patient is on Coreg and losartan.  3. Elevated troponin: Will continue to cycle cardiac enzymes x 3.  4. Chronic obstructive pulmonary disease exacerbation: The patient states that has cough with mild productive sputum. Keep the patient on Rocephin and Zithromax, DuoNebs and Solu-Medrol.  5. Debility: Will involve physical therapy and occupational therapy after the patient will be off of BiPAP.  6. Keep the patient on deep vein thrombosis prophylaxis with Lovenox.   TIME SPENT: 50 minutes.   ____________________________ Susa GriffinsPadmaja Beverlie Kurihara, MD pv:gb D: 08/22/2013 01:38:53 ET T: 08/22/2013 04:14:35 ET JOB#: [id number]  cc: Susa GriffinsPadmaja Kayce Betty, MD, <Dictator> Duane LopeJeffrey D. Judithann SheenSparks, MD Susa GriffinsPADMAJA Vondra Aldredge MD ELECTRONICALLY SIGNED 09/04/2013 22:41

## 2014-10-21 NOTE — Discharge Summary (Signed)
PATIENT NAME:  [name], Renna M MR#:  [id number] DATE OF BIRTH:  [birth year]  DATE OF ADMISSION:  07/04/2013 DATE OF DISCHARGE:  07/06/2013  REASON FOR ADMISSION: Shortness of breath.   HISTORY OF PRESENT ILLNESS: Please see the dictated HPI done by Dr. Clint GuyHower on 07/04/2013.   PAST MEDICAL HISTORY:  1.  Chronic respiratory failure, on oxygen.  2.  COPD.  3.  ASCD, status post coronary artery bypass graft.  4.  Ischemic cardiomyopathy.  5.  Status post pacemaker and defibrillator implant.  6.  History of atrial fibrillation.  7.  Hyperlipidemia.  8.  Gout.  9.  Status post mitral valve replacement.   MEDICATIONS ON ADMISSION: Please see admission note.   ALLERGIES: NEOSPORIN, PENICILLIN, SULFA, TETANUS AND TORADOL.   SOCIAL HISTORY: The patient has a remote history of tobacco abuse. No history of alcohol use.   FAMILY HISTORY: Positive for coronary artery disease and hypertension, but otherwise unremarkable.   REVIEW OF SYSTEMS: As per HPI.   PHYSICAL EXAMINATION:  GENERAL: The patient was chronically ill appearing, in minimal distress due to her respiratory status.  VITAL SIGNS: Remarkable for a blood pressure of 100/57, with a heart rate of 136, temperature of 98.2, respiratory rate of 24 and sats were 100% on 4 L.  HEENT: Unremarkable.  NECK: Supple without JVD.  LUNGS: Revealed wheezes and rhonchi. No dullness.  CARDIAC: Regular rate and rhythm. Normal S1, S2.  ABDOMEN: Soft and nontender.  EXTREMITIES: Without edema.  NEUROLOGIC: Grossly nonfocal.   LABORATORY DATA: Chest x-ray revealed small pleural effusion, but was otherwise unremarkable.   HOSPITAL COURSE: The patient was admitted with acute on chronic respiratory failure with COPD exacerbation. She was maintained on oxygen with IV steroids, DuoNeb SVNs and IV antibiotics. The patient improved with conservative therapy. Followup chest x-ray remained stable. She was weaned down to her baseline 2 L of oxygen per nasal  cannula. She was switched to oral antibiotics and oral steroids and continued to do well. By 07/06/2013, the patient was stable and ready for discharge.   DISCHARGE DIAGNOSES:  1.  Chronic obstructive pulmonary disease exacerbation.  2.  Acute on chronic respiratory failure.  3.  Presumed bronchitis.  4.  Atherosclerotic cardiovascular disease, status post coronary artery bypass graft in 2005. 5.  Ischemic cardiomyopathy.  6.  Valvular heart disease.  7.  Hyperlipidemia.   DISCHARGE MEDICATIONS:  1.  Colcrys 0.6 mg p.o. daily.  2.  Losartan 25 mg p.o. at bedtime.  3.  Coreg  6.25 mg p.o. b.i.d.  4.  Prednisone taper as directed.  5.  Zocor 40 mg p.o. at bedtime.  6.  Allopurinol 100 mg p.o. daily.  7.  Aspirin 81 mg p.o. daily.  8.  Lorazepam 0.5 mg p.o. q.6 hours p.r.n. anxiety.  9.  Advair 250/50 1 puff b.i.d.  10. Spiriva 1 capsule inhaled daily.  11. Demadex 20 mg p.o. b.i.d.  12. Zithromax 250 mg p.o. daily x 5 days.  13. Klor-Con 20 mEq p.o. daily.  14. Oxygen at 2 L per minute per nasal cannula.   FOLLOWUP PLANS AND APPOINTMENTS: The patient was discharged with oxygen on a low sodium diet. She will follow up with me within 1 week's time, sooner if needed.  ____________________________ Duane LopeJeffrey D. Judithann SheenSparks, MD jds:aw D: 07/11/2013 08:26:17 ET T: 07/11/2013 13:50:30 ET JOB#: [id number]  cc: Duane LopeJeffrey D. Judithann SheenSparks, MD, <Dictator> JEFFREY Rodena Medin SPARKS MD ELECTRONICALLY SIGNED 07/11/2013 17:02
